# Patient Record
Sex: Female | Born: 1950 | Race: White | Hispanic: No | Marital: Married | Smoking: Never smoker
Health system: Southern US, Community
[De-identification: ages and names within clinical notes are randomized; demographics above are authoritative.]

## PROBLEM LIST (undated history)

## (undated) DIAGNOSIS — T7840XA Allergy, unspecified, initial encounter: Secondary | ICD-10-CM

## (undated) DIAGNOSIS — R519 Headache, unspecified: Secondary | ICD-10-CM

## (undated) DIAGNOSIS — C801 Malignant (primary) neoplasm, unspecified: Secondary | ICD-10-CM

## (undated) DIAGNOSIS — Z923 Personal history of irradiation: Secondary | ICD-10-CM

## (undated) DIAGNOSIS — R51 Headache: Secondary | ICD-10-CM

## (undated) DIAGNOSIS — N200 Calculus of kidney: Secondary | ICD-10-CM

## (undated) DIAGNOSIS — H579 Unspecified disorder of eye and adnexa: Secondary | ICD-10-CM

## (undated) HISTORY — DX: Headache, unspecified: R51.9

## (undated) HISTORY — DX: Allergy, unspecified, initial encounter: T78.40XA

## (undated) HISTORY — PX: BREAST BIOPSY: SHX20

## (undated) HISTORY — DX: Unspecified disorder of eye and adnexa: H57.9

## (undated) HISTORY — DX: Headache: R51

## (undated) HISTORY — PX: HERNIA REPAIR: SHX51

## (undated) HISTORY — DX: Calculus of kidney: N20.0

## (undated) HISTORY — DX: Malignant (primary) neoplasm, unspecified: C80.1

---

## 2012-01-03 DIAGNOSIS — C801 Malignant (primary) neoplasm, unspecified: Secondary | ICD-10-CM

## 2012-01-03 HISTORY — DX: Malignant (primary) neoplasm, unspecified: C80.1

## 2012-03-04 HISTORY — PX: BREAST LUMPECTOMY: SHX2

## 2013-12-23 LAB — HM MAMMOGRAPHY

## 2014-06-17 DIAGNOSIS — H4011X1 Primary open-angle glaucoma, mild stage: Secondary | ICD-10-CM | POA: Diagnosis not present

## 2014-09-10 ENCOUNTER — Encounter: Payer: Self-pay | Admitting: Family Medicine

## 2014-09-10 ENCOUNTER — Ambulatory Visit (INDEPENDENT_AMBULATORY_CARE_PROVIDER_SITE_OTHER): Payer: Medicare Other | Admitting: Family Medicine

## 2014-09-10 VITALS — BP 116/70 | HR 88 | Temp 98.0°F | Ht 60.5 in | Wt 132.5 lb

## 2014-09-10 DIAGNOSIS — R519 Headache, unspecified: Secondary | ICD-10-CM | POA: Insufficient documentation

## 2014-09-10 DIAGNOSIS — C50912 Malignant neoplasm of unspecified site of left female breast: Secondary | ICD-10-CM | POA: Diagnosis not present

## 2014-09-10 DIAGNOSIS — Z8619 Personal history of other infectious and parasitic diseases: Secondary | ICD-10-CM

## 2014-09-10 DIAGNOSIS — C801 Malignant (primary) neoplasm, unspecified: Secondary | ICD-10-CM

## 2014-09-10 DIAGNOSIS — R51 Headache: Secondary | ICD-10-CM

## 2014-09-10 DIAGNOSIS — Z853 Personal history of malignant neoplasm of breast: Secondary | ICD-10-CM | POA: Insufficient documentation

## 2014-09-10 DIAGNOSIS — H579 Unspecified disorder of eye and adnexa: Secondary | ICD-10-CM

## 2014-09-10 DIAGNOSIS — N200 Calculus of kidney: Secondary | ICD-10-CM

## 2014-09-10 DIAGNOSIS — D0512 Intraductal carcinoma in situ of left breast: Secondary | ICD-10-CM | POA: Insufficient documentation

## 2014-09-10 DIAGNOSIS — I7300 Raynaud's syndrome without gangrene: Secondary | ICD-10-CM

## 2014-09-10 NOTE — Assessment & Plan Note (Signed)
Taking ASA, wears gloves and long sleeves when necessary.

## 2014-09-10 NOTE — Patient Instructions (Addendum)
Great to meet you. Please consider Prevnar 13 vaccine.  Please make an appointment for a Welcome to Medicare Visit on your way out.  We will call you with your referrals or you can stop by to see Rosaria Ferries on your way out.

## 2014-09-10 NOTE — Progress Notes (Signed)
Pre visit review using our clinic review tool, if applicable. No additional management support is needed unless otherwise documented below in the visit note. 

## 2014-09-10 NOTE — Progress Notes (Signed)
Subjective:   Patient ID: Kari Willis, female    DOB: 1951-03-09, 64 y.o.   MRN: [id number]  Kari Willis is a pleasant 64 y.o. year old female who presents to clinic today with [place]  on 09/10/2014  HPI:  Moved to area from [place] last fall.  Has family in the area and she has a h/o [place]auynauds--wanted to move somewhere with warmer climate.  Breast CA- left- s/p lumpectomy and radiation in 2013.  Has been seeing breast surgeon and oncology once a year. Per pt, mammogram UTD- fall of 2015. Unfortunately, got shingles 2 weeks into radiation but she did complete the tx. Has since received zostavax.  Colonoscopy 2014.  Raynauds- takes ASA which does seem to help.  Wears gloves as well.  No current outpatient prescriptions on file prior to visit.   No current facility-administered medications on file prior to visit.    Allergies  Allergen Reactions  . Demerol [Meperidine]     "burning sensation throughout body"    Past Medical History  Diagnosis Date  . Cancer oct 2013    breast  . Eye pressure     "high eye pressure"  . Kidney stones   . Headache     Past Surgical History  Procedure Laterality Date  . Breast lumpectomy  dec 2013  . Breast biopsy    . Hernia repair      Family History  Problem Relation Age of Onset  . Cancer Mother   . Heart disease Father   . Heart disease Sister   . Heart disease Brother   . Stroke Paternal Aunt     History   Social History  . Marital Status: Married    Spouse Name: N/A  . Number of Children: N/A  . Years of Education: N/A   Occupational History  . Not on file.   Social History Main Topics  . Smoking status: Never Smoker   . Smokeless tobacco: Never Used  . Alcohol Use: Yes  . Drug Use: No  . Sexual Activity: No   Other Topics Concern  . Not on file   Social History Narrative   The PMH, PSH, Social History, Family History, Medications, and allergies have been reviewed in [facility], and have been updated if  relevant.    Review of Systems  Constitutional: Negative.   HENT: Negative.   Eyes: Negative.   Respiratory: Negative.   Cardiovascular: Negative.   Gastrointestinal: Negative.   Endocrine: Negative.   Genitourinary: Negative.   Musculoskeletal: Negative.   Skin: Negative.   Allergic/Immunologic: Negative.   Neurological: Negative.   Hematological: Negative.   Psychiatric/Behavioral: Negative.   All other systems reviewed and are negative.      Objective:    BP 116/70 mmHg  Pulse 88  Temp(Src) 98 F (36.7 C) (Oral)  Ht 5' 0.5" (1.537 m)  Wt 132 lb 8 oz (60.102 kg)  BMI 25.44 kg/m2  SpO2 97%   Physical Exam  Constitutional: She is oriented to person, place, and time. She appears well-developed and well-nourished. No distress.  HENT:  Head: Normocephalic.  Eyes: Conjunctivae are normal.  Neck: Normal range of motion. Neck supple.  Cardiovascular: Normal rate, regular rhythm and normal heart sounds.   Pulmonary/Chest: Effort normal and breath sounds normal. No respiratory distress.  Abdominal: Soft. Bowel sounds are normal.  Musculoskeletal: Normal range of motion. She exhibits no edema.  Neurological: She is alert and oriented to person, place, and time. No cranial nerve deficit.  Skin: Skin is warm and dry.  Psychiatric: She has a normal mood and affect. Her behavior is normal. Judgment and thought content normal.  Nursing note and vitals reviewed.         Assessment & Plan:   Cancer  Eye pressure  Kidney stones No Follow-up on file.

## 2014-09-10 NOTE — Assessment & Plan Note (Signed)
S/p lumpectomy and radiation tx. Refer to breast oncology/surgery for surveillance, likely yearly.

## 2014-09-22 ENCOUNTER — Telehealth: Payer: Self-pay | Admitting: *Deleted

## 2014-09-22 ENCOUNTER — Other Ambulatory Visit: Payer: Self-pay | Admitting: Family Medicine

## 2014-09-22 DIAGNOSIS — C50912 Malignant neoplasm of unspecified site of left female breast: Secondary | ICD-10-CM

## 2014-09-22 NOTE — Telephone Encounter (Signed)
Called pt to schedule a med onc appt and since she lives closer to [place] and would like to be seen there.  Sent Dr. Deborra Medina an inbasket requesting for the referral to be sent to [place].

## 2014-10-07 ENCOUNTER — Telehealth: Payer: Self-pay

## 2014-10-07 NOTE — Telephone Encounter (Signed)
Pt has fasting lab later this week and pt wants to know if can drink anything prior to labs on 10/09/14. Advised pt can have water or black coffee. Pt voiced understanding.

## 2014-10-09 ENCOUNTER — Other Ambulatory Visit (INDEPENDENT_AMBULATORY_CARE_PROVIDER_SITE_OTHER): Payer: Medicare Other

## 2014-10-09 ENCOUNTER — Other Ambulatory Visit: Payer: Self-pay | Admitting: Family Medicine

## 2014-10-09 DIAGNOSIS — E785 Hyperlipidemia, unspecified: Secondary | ICD-10-CM | POA: Diagnosis not present

## 2014-10-09 DIAGNOSIS — Z0000 Encounter for general adult medical examination without abnormal findings: Secondary | ICD-10-CM

## 2014-10-09 DIAGNOSIS — Z01419 Encounter for gynecological examination (general) (routine) without abnormal findings: Secondary | ICD-10-CM | POA: Insufficient documentation

## 2014-10-09 LAB — CBC WITH DIFFERENTIAL/PLATELET
Basophils Absolute: 0 10*3/uL (ref 0.0–0.1)
Basophils Relative: 1 % (ref 0.0–3.0)
EOS ABS: 0.1 10*3/uL (ref 0.0–0.7)
Eosinophils Relative: 2.5 % (ref 0.0–5.0)
HCT: 40.1 % (ref 36.0–46.0)
HEMOGLOBIN: 13.7 g/dL (ref 12.0–15.0)
LYMPHS PCT: 38.6 % (ref 12.0–46.0)
Lymphs Abs: 1.7 10*3/uL (ref 0.7–4.0)
MCHC: 34 g/dL (ref 30.0–36.0)
MCV: 94.8 fl (ref 78.0–100.0)
MONOS PCT: 8.5 % (ref 3.0–12.0)
Monocytes Absolute: 0.4 10*3/uL (ref 0.1–1.0)
Neutro Abs: 2.2 10*3/uL (ref 1.4–7.7)
Neutrophils Relative %: 49.4 % (ref 43.0–77.0)
Platelets: 192 10*3/uL (ref 150.0–400.0)
RBC: 4.24 Mil/uL (ref 3.87–5.11)
RDW: 13.3 % (ref 11.5–15.5)
WBC: 4.4 10*3/uL (ref 4.0–10.5)

## 2014-10-09 LAB — LIPID PANEL
CHOL/HDL RATIO: 4
Cholesterol: 241 mg/dL — ABNORMAL HIGH (ref 0–200)
HDL: 54.3 mg/dL (ref >39.00)
LDL CALC: 167 mg/dL — AB (ref 0–99)
NONHDL: 186.7
Triglycerides: 99 mg/dL (ref 0.0–149.0)
VLDL: 19.8 mg/dL (ref 0.0–40.0)

## 2014-10-09 LAB — COMPREHENSIVE METABOLIC PANEL
ALBUMIN: 4.2 g/dL (ref 3.5–5.2)
ALK PHOS: 45 U/L (ref 39–117)
ALT: 20 U/L (ref 0–35)
AST: 18 U/L (ref 0–37)
BUN: 12 mg/dL (ref 6–23)
CO2: 29 mEq/L (ref 19–32)
Calcium: 9.7 mg/dL (ref 8.4–10.5)
Chloride: 103 mEq/L (ref 96–112)
Creatinine, Ser: 0.92 mg/dL (ref 0.40–1.20)
GFR: 65.05 mL/min (ref >60.00)
Glucose, Bld: 100 mg/dL — ABNORMAL HIGH (ref 70–99)
POTASSIUM: 4.3 meq/L (ref 3.5–5.1)
Sodium: 137 mEq/L (ref 135–145)
Total Bilirubin: 0.9 mg/dL (ref 0.2–1.2)
Total Protein: 7.1 g/dL (ref 6.0–8.3)

## 2014-10-09 LAB — TSH: TSH: 1.73 u[IU]/mL (ref 0.35–4.50)

## 2014-10-14 ENCOUNTER — Encounter: Payer: Self-pay | Admitting: Family Medicine

## 2014-10-14 ENCOUNTER — Ambulatory Visit (INDEPENDENT_AMBULATORY_CARE_PROVIDER_SITE_OTHER): Payer: Medicare Other | Admitting: Family Medicine

## 2014-10-14 VITALS — BP 118/66 | HR 63 | Temp 97.6°F | Ht 60.25 in | Wt 132.0 lb

## 2014-10-14 DIAGNOSIS — Z66 Do not resuscitate: Secondary | ICD-10-CM | POA: Diagnosis not present

## 2014-10-14 DIAGNOSIS — H579 Unspecified disorder of eye and adnexa: Secondary | ICD-10-CM

## 2014-10-14 DIAGNOSIS — I7300 Raynaud's syndrome without gangrene: Secondary | ICD-10-CM

## 2014-10-14 DIAGNOSIS — Z0000 Encounter for general adult medical examination without abnormal findings: Secondary | ICD-10-CM

## 2014-10-14 DIAGNOSIS — E785 Hyperlipidemia, unspecified: Secondary | ICD-10-CM | POA: Diagnosis not present

## 2014-10-14 DIAGNOSIS — C50912 Malignant neoplasm of unspecified site of left female breast: Secondary | ICD-10-CM

## 2014-10-14 DIAGNOSIS — Z8619 Personal history of other infectious and parasitic diseases: Secondary | ICD-10-CM

## 2014-10-14 NOTE — Addendum Note (Signed)
Addended by: Lucille Passy on: 10/14/2014 02:04 PM   Modules accepted: Miquel Dunn

## 2014-10-14 NOTE — Patient Instructions (Signed)
Good to see you. Have a great time in [place].  Please cut back foods higher in cholesterol and increase physical activity.  Please follow up in 3 months for labs.

## 2014-10-14 NOTE — Progress Notes (Addendum)
Subjective:   Patient ID: Kari Willis, female    DOB: [birth year], 64 y.o.   MRN: [id number]  Kari Willis is a pleasant 64 y.o. year old female who established care with me in 09/2014, presents to clinic today with Annual Exam  and follow up of chronic medical conditions on 10/14/2014  HPI:  I have personally reviewed the Medicare Annual Wellness questionnaire and have noted 1. The patient's medical and social history 2. Their use of alcohol, tobacco or illicit drugs 3. Their current medications and supplements 4. The patient's functional ability including ADL's, fall risks, home safety risks and hearing or visual             impairment. 5. Diet and physical activities 6. Evidence for depression or mood disorders  End of life wishes discussed and updated in Social History.  The roster of all physicians providing medical care to patient - is listed in the CareTeams section of the chart.   Moved to area from [place] last fall.  Has family in the area and she has a h/o Rauynauds--wanted to move somewhere with warmer climate.  Breast CA- left- s/p lumpectomy and radiation in 2013.  Has been seeing breast surgeon and oncology once a year. Per pt, mammogram UTD- fall of 2015. Unfortunately, got shingles 2 weeks into radiation but she did complete the tx. Has since received zostavax. Eye exam March 2016 in [place] Colonoscopy 2014. Pap smear 03/2014  Raynauds- takes ASA which does seem to help.  Wears gloves as well.  HLD- cholesterol elevated this month.  She takes Niacin and fish oil. Lab Results  Component Value Date   CHOL 241* 10/09/2014   HDL 54.30 10/09/2014   LDLCALC 167* 10/09/2014   TRIG 99.0 10/09/2014   CHOLHDL 4 10/09/2014   Lab Results  Component Value Date   CREATININE 0.92 10/09/2014   Lab Results  Component Value Date   WBC 4.4 10/09/2014   HGB 13.7 10/09/2014   HCT 40.1 10/09/2014   MCV 94.8 10/09/2014   PLT 192.0 10/09/2014   Lab Results  Component Value  Date   NA 137 10/09/2014   K 4.3 10/09/2014   CL 103 10/09/2014   CO2 29 10/09/2014   Lab Results  Component Value Date   TSH 1.73 10/09/2014    Current Outpatient Prescriptions on File Prior to Visit  Medication Sig Dispense Refill  . aspirin 81 MG tablet Take 81 mg by mouth 3 (three) times a week.    . B Complex Vitamins (B COMPLEX-B12 PO) Take 1 tablet by mouth daily.    . Calcium Carbonate-Vitamin D (CALCIUM 600+D HIGH POTENCY PO) Take 1 tablet by mouth 2 (two) times daily.    . Ferrous Gluconate (IRON) 240 (27 FE) MG TABS Take 1 tablet by mouth 2 (two) times a week.    . Multiple Vitamins-Minerals (CENTRUM SILVER ADULT 50+ PO) Take 1 tablet by mouth daily.    . niacin 100 MG tablet Take 100 mg by mouth at bedtime.    . Omega-3 Fatty Acids (FISH OIL) 1200 MG CAPS Take 1 capsule by mouth.    . timolol (BETIMOL) 0.5 % ophthalmic solution Place 1 drop into both eyes 2 (two) times daily.    . vitamin C (ASCORBIC ACID) 500 MG tablet Take 500 mg by mouth 2 (two) times daily.    . vitamin E 400 UNIT capsule Take 400 Units by mouth daily.     No current facility-administered medications on file prior  to visit.    Allergies  Allergen Reactions  . Demerol [Meperidine]     "burning sensation throughout body"    Past Medical History  Diagnosis Date  . Cancer oct 2013    breast  . Eye pressure     "high eye pressure"  . Kidney stones   . Headache     Past Surgical History  Procedure Laterality Date  . Breast lumpectomy  dec 2013  . Breast biopsy    . Hernia repair      Family History  Problem Relation Age of Onset  . Cancer Mother   . Heart disease Father   . Heart disease Sister   . Heart disease Brother   . Stroke Paternal Aunt     History   Social History  . Marital Status: Married    Spouse Name: N/A  . Number of Children: N/A  . Years of Education: N/A   Occupational History  . Not on file.   Social History Main Topics  . Smoking status: Never Smoker    . Smokeless tobacco: Never Used  . Alcohol Use: Yes  . Drug Use: No  . Sexual Activity: No   Other Topics Concern  . Not on file   Social History Narrative   Married.   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    Review of Systems  Constitutional: Negative.   HENT: Negative.   Eyes: Negative.   Respiratory: Negative.   Cardiovascular: Negative.   Gastrointestinal: Negative.   Endocrine: Negative.   Genitourinary: Negative.   Musculoskeletal: Negative.   Skin: Negative.   Allergic/Immunologic: Negative.   Neurological: Negative.   Hematological: Negative.   Psychiatric/Behavioral: Negative.   All other systems reviewed and are negative.      Objective:    BP 118/66 mmHg  Pulse 63  Temp(Src) 97.6 F (36.4 C) (Oral)  Ht 5' 0.25" (1.53 m)  Wt 132 lb (59.875 kg)  BMI 25.58 kg/m2  SpO2 98%   Physical Exam  Constitutional: She is oriented to person, place, and time. She appears well-developed and well-nourished. No distress.  HENT:  Head: Normocephalic.  Eyes: Conjunctivae are normal.  Neck: Normal range of motion. Neck supple.  Cardiovascular: Normal rate, regular rhythm and normal heart sounds.   Pulmonary/Chest: Effort normal and breath sounds normal. No respiratory distress.  Abdominal: Soft. Bowel sounds are normal.  Musculoskeletal: Normal range of motion. She exhibits no edema.  Neurological: She is alert and oriented to person, place, and time. No cranial nerve deficit.  Skin: Skin is warm and dry.  Psychiatric: She has a normal mood and affect. Her behavior is normal. Judgment and thought content normal.  Nursing note and vitals reviewed.         Assessment & Plan:   Welcome to Medicare preventive visit  Eye pressure  Breast cancer, left breast  History of shingles  Raynaud disease No  Follow-up on file.

## 2014-10-14 NOTE — Assessment & Plan Note (Signed)
End of life wishes discussed and updated in Social History.

## 2014-10-14 NOTE — Assessment & Plan Note (Signed)
New-  Discussed tx options.  She wants to work on diet and exercise.  Does have strong FH of HLD. Follow up labs in 3 months.

## 2014-10-14 NOTE — Assessment & Plan Note (Addendum)
The patients weight, height, BMI and visual acuity have been recorded in the chart.  Cognitive function assessed.   I have made referrals, counseling and provided education to the patient based review of the above and I have provided the pt with a written personalized care plan for preventive services.  EKG done today- reassuring- sinus rhythm with borderline short PR.  Prevnar 13 today. Declined prevnar 13.

## 2014-10-14 NOTE — Assessment & Plan Note (Signed)
Getting established with local breast surgeon and oncologist.  Mammogram UTD.

## 2014-10-14 NOTE — Progress Notes (Signed)
Pre visit review using our clinic review tool, if applicable. No additional management support is needed unless otherwise documented below in the visit note. 

## 2014-11-03 ENCOUNTER — Encounter: Payer: Self-pay | Admitting: *Deleted

## 2014-11-11 ENCOUNTER — Encounter: Payer: Self-pay | Admitting: Family Medicine

## 2014-11-18 ENCOUNTER — Encounter: Payer: Self-pay | Admitting: Internal Medicine

## 2014-11-18 ENCOUNTER — Inpatient Hospital Stay: Payer: Medicare Other | Attending: Internal Medicine | Admitting: Internal Medicine

## 2014-11-18 VITALS — BP 117/77 | HR 66 | Temp 98.0°F | Resp 18 | Ht 60.25 in | Wt 133.6 lb

## 2014-11-18 DIAGNOSIS — Z923 Personal history of irradiation: Secondary | ICD-10-CM | POA: Diagnosis not present

## 2014-11-18 DIAGNOSIS — D0512 Intraductal carcinoma in situ of left breast: Secondary | ICD-10-CM | POA: Insufficient documentation

## 2014-11-18 DIAGNOSIS — Z171 Estrogen receptor negative status [ER-]: Secondary | ICD-10-CM | POA: Diagnosis not present

## 2014-11-18 NOTE — Progress Notes (Signed)
Pt new pt to get established for DCIS 2013.  Pt had her last mammogram dec 2015 in [place] where she lived until Dec 2015.  She has all her films/CD for her mammograms so when she needs next mammogram she will bring with her to appt.  She has appt in sept with surgeon in [place] and once she sees that md she will let us know if she needs mammogram ordered by [place] or if surgeon going to order it.  My direct number written down for her to call me.

## 2014-12-01 NOTE — Progress Notes (Signed)
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 Layla Maw on 03/16/12 (in [place], [place]). Then completed radiation on 06/08/12.   Surgical Pathology report on 03/16/12 - wire guided lumpectomy - scattered ducts with residual DCIS adjacent to prior biopsy site; close medial margin (0.2 cm); no invasive carcinoma seen. Initial biopsy 02/15/12  - DCIS, grade 3, ER and PR negative.  -  patient recently moved here, referred here for establishing Oncology follow-up.   HISTORY OF PRESENT ILLNESS:  Kari Willis is a 64 year old female with past medical and surgical history as described below. Patient has history of DCIS left breast diagnosed and treated in [facility] in 2013. Records by Dr. Paulo Fruit from there states that she was initially found to have abnormal mammogram in November 2013, she underwent stereotactic biopsy on November 13 and was found to have ER/PR negative DCIS and underwent lumpectomy on 03/16/2012. Surgical pathology was DCIS intermediate nuclear grade, she then completed radiation March 2014. Patient states that she also had another left breast biopsy in 2015 which was reportedly negative for DCIS of malignancy. Clinically otherwise doing well. Appetite is good, denies unintentional weight loss. No new bone pains. Denies feeling any new breast masses on self exam.   REVIEW OF SYSTEMS:   ROS CONSTITUTIONAL: As in HPI above. No chills, fever or sweats.    ENT:  No headache, dizziness or epistaxis. No ear or jaw pain. RESPIRATORY:   No cough.  No shortness of breath. No wheezing. No hemoptysis. CARDIAC:  No  palpitations.  No retrosternal chest pain. No orthopnea, PND. GI:  No abdominal pain, nausea or vomiting. No diarrhea.   GU:  No dysuria or hematuria.  SKIN: No rashes or pruritus. HEMATOLOGIC: denies bleeding symptoms MUSCULOSKELETAL:  No new bone pains.  EXTREMITY:  No new swelling or pain.  NEURO:  No focal weakness. No numbness or tingling of extremities.     ENDOCRINE:  No polyuria or polydipsia.   PAST MEDICAL HISTORY: Reviewed. Past Medical History  Diagnosis Date  . Cancer oct 2013    breast  . Eye pressure     "high eye pressure"  . Kidney stones   . Headache   Ocular hypertension Cervical dysplasia, status post cervical cone biopsy 1990s   PAST SURGICAL HISTORY: Reviewed. Past Surgical History  Procedure Laterality Date  . Breast lumpectomy  dec 2013  . Breast biopsy    . Hernia repair      FAMILY HISTORY: Reviewed. Family History  Problem Relation Age of Onset  . Cancer Mother   . Heart disease Father   . Heart disease Sister   . Heart disease Brother   . Stroke Paternal Aunt     SOCIAL HISTORY: Reviewed. Social History  Substance Use Topics  . Smoking status: Never Smoker   . Smokeless tobacco: Never Used  . Alcohol Use: Yes     Comment: one glass of wine every 6 months    Allergies  Allergen Reactions  . Demerol [Meperidine]     "burning sensation throughout body"    Current Outpatient Prescriptions  Medication Sig Dispense Refill  . aspirin 81 MG tablet Take 81 mg by mouth 3 (  three) times a week.    . B Complex Vitamins (B COMPLEX-B12 PO) Take 1 tablet by mouth daily.    . Calcium Carbonate-Vitamin D (CALCIUM 600+D HIGH POTENCY PO) Take 1 tablet by mouth 2 (two) times daily.    . Ferrous Gluconate (IRON) 240 (27 FE) MG TABS Take 1 tablet by mouth 2 (two) times a week.    . Multiple Vitamins-Minerals (CENTRUM SILVER ADULT 50+ PO) Take 1 tablet by mouth daily.    . niacin 250 MG tablet Take 250 mg by mouth at bedtime.    . Omega-3 Fatty Acids  (FISH OIL) 1200 MG CAPS Take 1 capsule by mouth.    . timolol (BETIMOL) 0.5 % ophthalmic solution Place 1 drop into both eyes 2 (two) times daily.    . vitamin C (ASCORBIC ACID) 500 MG tablet Take 500 mg by mouth 2 (two) times daily.    . vitamin E 400 UNIT capsule Take 400 Units by mouth daily.     No current facility-administered medications for this visit.    PHYSICAL EXAM: Filed Vitals:   11/18/14 0938  BP: 117/77  Pulse: 66  Temp: 98 F (36.7 C)  Resp: 18     Body mass index is 25.89 kg/(m^2).     GENERAL: Patient is alert and oriented and in no acute distress. There is no icterus. HEENT: EOMs intact. Oral exam negative for thrush or lesions. No cervical lymphadenopathy. CVS: S1S2, regular LUNGS: Bilaterally clear to auscultation, no rhonchi. ABDOMEN: Soft, nontender. No hepatomegaly clinically.  NEURO: grossly nonfocal, cranial nerves are intact.  EXTREMITIES: No pedal edema. SKIN: No major bruising or rash BREASTS: No dominant masses in either breast. No axillary adenopathy on either side. Exam performed in presence of a nurse.   LAB RESULTS:    Component Value Date/Time   NA 137 10/09/2014 1013   K 4.3 10/09/2014 1013   CL 103 10/09/2014 1013   CO2 29 10/09/2014 1013   GLUCOSE 100* 10/09/2014 1013   BUN 12 10/09/2014 1013   CREATININE 0.92 10/09/2014 1013   CALCIUM 9.7 10/09/2014 1013   PROT 7.1 10/09/2014 1013   ALBUMIN 4.2 10/09/2014 1013   AST 18 10/09/2014 1013   ALT 20 10/09/2014 1013   ALKPHOS 45 10/09/2014 1013   BILITOT 0.9 10/09/2014 1013    Lab Results  Component Value Date   WBC 4.4 10/09/2014   NEUTROABS 2.2 10/09/2014   HGB 13.7 10/09/2014   HCT 40.1 10/09/2014   MCV 94.8 10/09/2014   PLT 192.0 10/09/2014     ASSESSMENT / PLAN:   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 Layla Maw on 03/16/12 (in [place], [place]). Then  completed radiation on 06/08/12.  Surgical Pathology report on 03/16/12 - wire guided lumpectomy - scattered ducts with residual DCIS adjacent to prior biopsy site; close medial margin (0.2 cm); no invasive carcinoma seen.  Initial biopsy 02/15/12  - DCIS, grade 3, ER and PR negative. -  patient recently moved here, referred here for establishing Oncology follow-up. Have reviewed previous records sent to me and discussed with patient. She has a history of DCIS treated in 2013/2014 as described above, was not a candidate for tamoxifen treatment since ER/PR was negative. Clinically no abnormal masses felt on breast exam today, patient states that last mammogram in September 2015 also was unremarkable. States that next mammogram has been scheduled for this coming September/October. She prefers  to continue surveillance with oncology, will therefore see her back in one year with CBC, creatinine, LFT. In between visits, she was advised to call in case of any new breast masses felt on self-exam or other concerns and will be evaluated sooner. Patient is agreeable to this plan.   Leia Alf, MD   12/01/2014 12:43 PM

## 2014-12-17 ENCOUNTER — Other Ambulatory Visit: Payer: Self-pay | Admitting: Surgery

## 2014-12-17 DIAGNOSIS — Z853 Personal history of malignant neoplasm of breast: Secondary | ICD-10-CM | POA: Diagnosis not present

## 2014-12-19 ENCOUNTER — Telehealth: Payer: Self-pay | Admitting: *Deleted

## 2014-12-19 NOTE — Telephone Encounter (Signed)
Pt states that she saw Dr. Lucia Gaskins and he rec: seeing her every 6 months and rec: that pandit see her every 6 months so that she is seen twice a year for breast cancer.  She did have dr. Lucia Gaskins to schedule her a mammogram for there in the facility where he is and when the calendar opens up for her 6 month appt for [facility] then she will call us and get an appt for pandit.

## 2015-01-07 ENCOUNTER — Other Ambulatory Visit: Payer: Self-pay | Admitting: Family Medicine

## 2015-01-07 DIAGNOSIS — E785 Hyperlipidemia, unspecified: Secondary | ICD-10-CM

## 2015-01-14 ENCOUNTER — Other Ambulatory Visit (INDEPENDENT_AMBULATORY_CARE_PROVIDER_SITE_OTHER): Payer: Medicare Other

## 2015-01-14 DIAGNOSIS — E785 Hyperlipidemia, unspecified: Secondary | ICD-10-CM

## 2015-01-14 LAB — LIPID PANEL
Cholesterol: 230 mg/dL — ABNORMAL HIGH (ref 0–200)
HDL: 51.7 mg/dL (ref >39.00)
LDL CALC: 154 mg/dL — AB (ref 0–99)
NONHDL: 178.64
Total CHOL/HDL Ratio: 4
Triglycerides: 122 mg/dL (ref 0.0–149.0)
VLDL: 24.4 mg/dL (ref 0.0–40.0)

## 2015-01-14 LAB — COMPREHENSIVE METABOLIC PANEL
ALT: 21 U/L (ref 0–35)
AST: 20 U/L (ref 0–37)
Albumin: 4.3 g/dL (ref 3.5–5.2)
Alkaline Phosphatase: 51 U/L (ref 39–117)
BUN: 13 mg/dL (ref 6–23)
CHLORIDE: 102 meq/L (ref 96–112)
CO2: 32 mEq/L (ref 19–32)
Calcium: 10 mg/dL (ref 8.4–10.5)
Creatinine, Ser: 0.87 mg/dL (ref 0.40–1.20)
GFR: 69.33 mL/min (ref >60.00)
GLUCOSE: 106 mg/dL — AB (ref 70–99)
POTASSIUM: 4.8 meq/L (ref 3.5–5.1)
SODIUM: 138 meq/L (ref 135–145)
Total Bilirubin: 0.8 mg/dL (ref 0.2–1.2)
Total Protein: 7.2 g/dL (ref 6.0–8.3)

## 2015-01-15 ENCOUNTER — Encounter: Payer: Self-pay | Admitting: *Deleted

## 2015-01-21 ENCOUNTER — Ambulatory Visit (INDEPENDENT_AMBULATORY_CARE_PROVIDER_SITE_OTHER): Payer: Medicare Other | Admitting: *Deleted

## 2015-01-21 DIAGNOSIS — Z23 Encounter for immunization: Secondary | ICD-10-CM | POA: Diagnosis not present

## 2015-03-13 ENCOUNTER — Telehealth: Payer: Self-pay | Admitting: *Deleted

## 2015-03-13 NOTE — Telephone Encounter (Signed)
Lm on pts vm requesting a cb if wanting to schedule a flu shot

## 2015-03-18 ENCOUNTER — Ambulatory Visit
Admission: RE | Admit: 2015-03-18 | Discharge: 2015-03-18 | Disposition: A | Discharge status: Home / Self Care | Payer: Medicare Other | Source: Ambulatory Visit | Attending: Surgery | Admitting: Surgery

## 2015-03-18 DIAGNOSIS — R922 Inconclusive mammogram: Secondary | ICD-10-CM | POA: Diagnosis not present

## 2015-03-18 NOTE — Telephone Encounter (Signed)
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   Form in Dr. Hulen [facility] tower in-box.

## 2015-03-24 ENCOUNTER — Telehealth: Payer: Self-pay | Admitting: *Deleted

## 2015-03-24 NOTE — Telephone Encounter (Signed)
Pt called stating on MyChart it is saying she is still overdue for her flu shot and would like this to be updated. She said she got her flu shot and a tetanus shot done on 02/12/15.

## 2015-03-24 NOTE — Telephone Encounter (Signed)
Her chart has been updated.

## 2015-03-24 NOTE — Telephone Encounter (Signed)
Chart updated

## 2015-03-24 NOTE — Telephone Encounter (Signed)
Please see note below, this was for the wife not the husband.    Despina Hidden, CMA at 03/24/2015 2:26 PM     Status: Signed       Expand All Collapse All   This was already done, and it's in the pt's chart.            Chad Cordial at 03/24/2015 1:38 PM     Status: Signed       Expand All Collapse All   Pt's wife called in asking if his MyChart could be updated with the flu shot being done on 02/12/15. It is still saying he is overdue for one.

## 2015-04-02 ENCOUNTER — Ambulatory Visit (INDEPENDENT_AMBULATORY_CARE_PROVIDER_SITE_OTHER): Payer: Medicare Other | Admitting: Internal Medicine

## 2015-04-02 ENCOUNTER — Encounter: Payer: Self-pay | Admitting: Internal Medicine

## 2015-04-02 VITALS — BP 116/68 | HR 66 | Temp 98.1°F | Wt 133.0 lb

## 2015-04-02 DIAGNOSIS — K1120 Sialoadenitis, unspecified: Secondary | ICD-10-CM

## 2015-04-02 MED ORDER — CLINDAMYCIN HCL 300 MG PO CAPS: 300.0000 mg | ORAL_CAPSULE | Freq: Three times a day (TID) | ORAL | Status: DC

## 2015-04-02 NOTE — Progress Notes (Signed)
Pre visit review using our clinic review tool, if applicable. No additional management support is needed unless otherwise documented below in the visit note. 

## 2015-04-02 NOTE — Progress Notes (Signed)
Subjective:    Patient ID: Kari Willis, female    DOB: 01/01/1951, 64 y.o.   MRN: [id number]  HPI  Pt presents to the clinic today with c/o right side facial swelling. She noticed this yesterday after sucking on a piece of Lindt chocolate. She has no pain with chewing. She has not difficulty breathing or swelling of the throat. She has had Lindt chocolate in the past. She has tried Motrin with minimal relief. She did try to make an appt with a dentist but reports she could not get in until Monday.  Review of Systems      Past Medical History  Diagnosis Date  . Cancer [facility]) oct 2013    breast  . Eye pressure     "high eye pressure"  . Kidney stones   . Headache     Current Outpatient Prescriptions  Medication Sig Dispense Refill  . aspirin 81 MG tablet Take 81 mg by mouth 3 (three) times a week.    . B Complex Vitamins (B COMPLEX-B12 PO) Take 1 tablet by mouth daily.    . Calcium Carbonate-Vitamin D (CALCIUM 600+D HIGH POTENCY PO) Take 1 tablet by mouth 2 (two) times daily.    . Ferrous Gluconate (IRON) 240 (27 FE) MG TABS Take 1 tablet by mouth 2 (two) times a week.    . Multiple Vitamins-Minerals (CENTRUM SILVER ADULT 50+ PO) Take 1 tablet by mouth daily.    . niacin 250 MG tablet Take 250 mg by mouth at bedtime.    . Omega-3 Fatty Acids (FISH OIL) 1200 MG CAPS Take 1 capsule by mouth.    . timolol (BETIMOL) 0.5 % ophthalmic solution Place 1 drop into both eyes 2 (two) times daily.    . vitamin C (ASCORBIC ACID) 500 MG tablet Take 500 mg by mouth 2 (two) times daily.    . vitamin E 400 UNIT capsule Take 400 Units by mouth daily.     No current facility-administered medications for this visit.    Allergies  Allergen Reactions  . Demerol [Meperidine]     "burning sensation throughout body"    Family History  Problem Relation Age of Onset  . Cancer Mother   . Heart disease Father   . Heart disease Sister   . Heart disease Brother   . Stroke Paternal Aunt      Social History   Social History  . Marital Status: Married    Spouse Name: N/A  . Number of Children: N/A  . Years of Education: N/A   Occupational History  . Not on file.   Social History Main Topics  . Smoking status: Never Smoker   . Smokeless tobacco: Never Used  . Alcohol Use: Yes     Comment: one glass of wine every 6 months  . Drug Use: No  . Sexual Activity: No   Other Topics Concern  . Not on file   Social History Narrative   Married.   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  HEENT: Denies eye pain, eye redness, ear pain, ringing in the ears, wax buildup, runny nose, nasal congestion, bloody nose, or sore throat. Respiratory: Denies difficulty breathing, shortness of breath, cough or sputum production.   Cardiovascular: Denies chest pain, chest tightness, palpitations or swelling in the  hands or feet.  Musculoskeletal: Pt reports jaw pain. Denies decrease in range of motion, difficulty with gait, muscle pain.  Skin: Denies redness, rashes, lesions or ulcercations.    No other specific complaints in a complete review of systems (except as listed in HPI above).  Objective:   Physical Exam   BP 116/68 mmHg  Pulse 66  Temp(Src) 98.1 F (36.7 C) (Oral)  Wt 133 lb (60.328 kg)  SpO2 98% Wt Readings from Last 3 Encounters:  04/02/15 133 lb (60.328 kg)  11/18/14 133 lb 9.6 oz (60.6 kg)  10/14/14 132 lb (59.875 kg)    General: Appears her stated age, well developed, well nourished in NAD. Skin: Warm, dry and intact. Redness noted over right jaw. HEENT: Head: normal shape and size; Throat/Mouth: Teeth present, mucosa pink and moist, no exudate, lesions or ulcerations noted.  Neck:  Parotid mass enlarged, hard and tender to touch. Cardiovascular: Normal rate and rhythm. S1,S2 noted.  No murmur, rubs  or gallops noted.  Pulmonary/Chest: Normal effort and positive vesicular breath sounds. No respiratory distress. No wheezes, rales or ronchi noted.   BMET    Component Value Date/Time   NA 138 01/14/2015 0843   K 4.8 01/14/2015 0843   CL 102 01/14/2015 0843   CO2 32 01/14/2015 0843   GLUCOSE 106* 01/14/2015 0843   BUN 13 01/14/2015 0843   CREATININE 0.87 01/14/2015 0843   CALCIUM 10.0 01/14/2015 0843    Lipid Panel     Component Value Date/Time   CHOL 230* 01/14/2015 0843   TRIG 122.0 01/14/2015 0843   HDL 51.70 01/14/2015 0843   CHOLHDL 4 01/14/2015 0843   VLDL 24.4 01/14/2015 0843   LDLCALC 154* 01/14/2015 0843    CBC    Component Value Date/Time   WBC 4.4 10/09/2014 1013   RBC 4.24 10/09/2014 1013   HGB 13.7 10/09/2014 1013   HCT 40.1 10/09/2014 1013   PLT 192.0 10/09/2014 1013   MCV 94.8 10/09/2014 1013   MCHC 34.0 10/09/2014 1013   RDW 13.3 10/09/2014 1013   LYMPHSABS 1.7 10/09/2014 1013   MONOABS 0.4 10/09/2014 1013   EOSABS 0.1 10/09/2014 1013   BASOSABS 0.0 10/09/2014 1013    Hgb A1C No results found for: HGBA1C      Assessment & Plan:   Parotitis, right side:  eRx for Clindamycin TID x 10 days Continue Motrin Try hot or cool compress for comfort Keep the appt with the dentist in case symptoms worsen over the weekend Return precautions given  RTC as needed or if symptoms persist or worsen

## 2015-04-02 NOTE — Patient Instructions (Signed)
Parotitis Parotitis is soreness and inflammation of one or both parotid glands. The parotid glands produce saliva. They are located on each side of the face, below and in front of the earlobes. The saliva produced comes out of tiny openings (ducts) inside the cheeks. In most cases, parotitis goes away over time or with treatment. If your parotitis is caused by certain long-term (chronic) diseases, it may come back again.  CAUSES  Parotitis can be caused by:  Viral infections. Mumps is one viral infection that can cause parotitis.  Bacterial infections.  Blockage of the salivary ducts due to a salivary stone.  Narrowing of the salivary ducts.  Swelling of the salivary ducts.  Dehydration.  Autoimmune conditions, such as sarcoidosis or Sjogren syndrome.  Air from activities such as scuba diving, glass blowing, or playing an instrument (rare).  Human immunodeficiency virus (HIV) or acquired immunodeficiency syndrome (AIDS).  Tuberculosis. SIGNS AND SYMPTOMS   The ears may appear to be pushed up and out from their normal position.  Redness (erythema) of the skin over the parotid glands.  Pain and tenderness over the parotid glands.  Swelling in the parotid gland area.  Yellowish-white fluid (pus) coming from the ducts inside the cheeks.  Dry mouth.  Bad taste in the mouth. DIAGNOSIS  Your health care provider may determine that you have parotitis based on your symptoms and a physical exam. A sample of fluid may also be taken from the parotid gland and tested to find the cause of your infection. X-rays or computed tomography (CT) scans may be taken if your health care provider thinks you might have a salivary stone blocking your salivary duct. TREATMENT  Treatment varies depending upon the cause of your parotitis. If your parotitis is caused by mumps, no treatment is needed. The condition will go away on its own after 7 to 10 days. In other cases, treatment may  include:  Antibiotic medicine if your infection was caused by bacteria.  Pain medicines.  Gland massage.  Eating sour candy to increase your saliva production.  Removal of salivary stones. Your health care provider may flush stones out with fluids or remove them with tweezers.  Surgery to remove the parotid glands. HOME CARE INSTRUCTIONS   If you were prescribed an antibiotic medicine, finish it all even if you start to feel better.  Put warm compresses on the sore area.  Take medicines only as directed by your health care provider.  Drink enough fluids to keep your urine clear or pale yellow. SEEK IMMEDIATE MEDICAL CARE IF:   You have increasing pain or swelling that is not controlled with medicine.  You have a fever. MAKE SURE YOU:  Understand these instructions.  Will watch your condition.  Will get help right away if you are not doing well or get worse.   This information is not intended to replace advice given to you by your health care provider. Make sure you discuss any questions you have with your health care provider.   Document Released: 09/10/2001 Document Revised: 04/11/2014 Document Reviewed: 08/14/2014 Elsevier Interactive Patient Education 2016 [employer]  

## 2015-06-11 DIAGNOSIS — Z853 Personal history of malignant neoplasm of breast: Secondary | ICD-10-CM | POA: Diagnosis not present

## 2015-06-16 DIAGNOSIS — H524 Presbyopia: Secondary | ICD-10-CM | POA: Diagnosis not present

## 2015-06-16 DIAGNOSIS — H40003 Preglaucoma, unspecified, bilateral: Secondary | ICD-10-CM | POA: Diagnosis not present

## 2015-06-16 DIAGNOSIS — H5212 Myopia, left eye: Secondary | ICD-10-CM | POA: Diagnosis not present

## 2015-06-16 DIAGNOSIS — H52221 Regular astigmatism, right eye: Secondary | ICD-10-CM | POA: Diagnosis not present

## 2015-06-30 DIAGNOSIS — H40053 Ocular hypertension, bilateral: Secondary | ICD-10-CM | POA: Diagnosis not present

## 2015-06-30 DIAGNOSIS — H5340 Unspecified visual field defects: Secondary | ICD-10-CM | POA: Diagnosis not present

## 2015-10-09 ENCOUNTER — Other Ambulatory Visit: Payer: Self-pay | Admitting: Family Medicine

## 2015-10-09 DIAGNOSIS — Z01419 Encounter for gynecological examination (general) (routine) without abnormal findings: Secondary | ICD-10-CM | POA: Insufficient documentation

## 2015-10-09 DIAGNOSIS — E785 Hyperlipidemia, unspecified: Secondary | ICD-10-CM

## 2015-10-14 ENCOUNTER — Telehealth: Payer: Self-pay

## 2015-10-14 DIAGNOSIS — Z1159 Encounter for screening for other viral diseases: Secondary | ICD-10-CM

## 2015-10-14 NOTE — Telephone Encounter (Signed)
Adding Hep C to lab orders

## 2015-10-15 ENCOUNTER — Ambulatory Visit (INDEPENDENT_AMBULATORY_CARE_PROVIDER_SITE_OTHER): Payer: Medicare Other

## 2015-10-15 ENCOUNTER — Other Ambulatory Visit (INDEPENDENT_AMBULATORY_CARE_PROVIDER_SITE_OTHER): Payer: Medicare Other

## 2015-10-15 VITALS — BP 100/68 | HR 56 | Temp 97.9°F | Ht 60.5 in | Wt 128.2 lb

## 2015-10-15 DIAGNOSIS — Z0000 Encounter for general adult medical examination without abnormal findings: Secondary | ICD-10-CM

## 2015-10-15 DIAGNOSIS — Z1159 Encounter for screening for other viral diseases: Secondary | ICD-10-CM

## 2015-10-15 DIAGNOSIS — Z01419 Encounter for gynecological examination (general) (routine) without abnormal findings: Secondary | ICD-10-CM

## 2015-10-15 DIAGNOSIS — E785 Hyperlipidemia, unspecified: Secondary | ICD-10-CM

## 2015-10-15 LAB — TSH: TSH: 2.16 u[IU]/mL (ref 0.35–4.50)

## 2015-10-15 LAB — CBC WITH DIFFERENTIAL/PLATELET
Basophils Absolute: 0.1 10*3/uL (ref 0.0–0.1)
Basophils Relative: 1.3 % (ref 0.0–3.0)
EOS PCT: 3.6 % (ref 0.0–5.0)
Eosinophils Absolute: 0.2 10*3/uL (ref 0.0–0.7)
HCT: 40.8 % (ref 36.0–46.0)
Hemoglobin: 13.6 g/dL (ref 12.0–15.0)
LYMPHS ABS: 1.8 10*3/uL (ref 0.7–4.0)
Lymphocytes Relative: 34.4 % (ref 12.0–46.0)
MCHC: 33.5 g/dL (ref 30.0–36.0)
MCV: 95 fl (ref 78.0–100.0)
MONO ABS: 0.5 10*3/uL (ref 0.1–1.0)
MONOS PCT: 9.4 % (ref 3.0–12.0)
NEUTROS ABS: 2.7 10*3/uL (ref 1.4–7.7)
NEUTROS PCT: 51.3 % (ref 43.0–77.0)
PLATELETS: 197 10*3/uL (ref 150.0–400.0)
RBC: 4.29 Mil/uL (ref 3.87–5.11)
RDW: 13.1 % (ref 11.5–15.5)
WBC: 5.3 10*3/uL (ref 4.0–10.5)

## 2015-10-15 LAB — COMPREHENSIVE METABOLIC PANEL
ALT: 19 U/L (ref 0–35)
AST: 20 U/L (ref 0–37)
Albumin: 4.3 g/dL (ref 3.5–5.2)
Alkaline Phosphatase: 45 U/L (ref 39–117)
BUN: 8 mg/dL (ref 6–23)
CHLORIDE: 104 meq/L (ref 96–112)
CO2: 33 meq/L — AB (ref 19–32)
Calcium: 10 mg/dL (ref 8.4–10.5)
Creatinine, Ser: 0.93 mg/dL (ref 0.40–1.20)
GFR: 64.05 mL/min (ref >60.00)
GLUCOSE: 104 mg/dL — AB (ref 70–99)
POTASSIUM: 5 meq/L (ref 3.5–5.1)
SODIUM: 139 meq/L (ref 135–145)
TOTAL PROTEIN: 7.2 g/dL (ref 6.0–8.3)
Total Bilirubin: 0.9 mg/dL (ref 0.2–1.2)

## 2015-10-15 LAB — LIPID PANEL
CHOLESTEROL: 201 mg/dL — AB (ref 0–200)
HDL: 47.6 mg/dL (ref >39.00)
LDL CALC: 129 mg/dL — AB (ref 0–99)
NonHDL: 153.24
TRIGLYCERIDES: 122 mg/dL (ref 0.0–149.0)
Total CHOL/HDL Ratio: 4
VLDL: 24.4 mg/dL (ref 0.0–40.0)

## 2015-10-15 NOTE — Patient Instructions (Signed)
Kari Willis , Thank you for taking time to come for your Medicare Wellness Visit. I appreciate your ongoing commitment to your health goals. Please review the following plan we discussed and let me know if I can assist you in the future.   These are the goals we discussed: Goals    . Increase physical activity     Starting 10/15/2015, I will continue to exercise for at least 30 min 3 days per week.        This is a list of the screening recommended for you and due dates:  Health Maintenance  Topic Date Due  . Flu Shot  11/03/2015  . Pneumonia vaccines (2 of 2 - PPSV23) 01/21/2016  . Mammogram  03/17/2017  . Colon Cancer Screening  02/09/2023  . Tetanus Vaccine  02/11/2025  . DEXA scan (bone density measurement)  Completed  . Shingles Vaccine  Completed  .  Hepatitis C: One time screening is recommended by Center for Disease Control  (CDC) for  adults born from [birth year] through 1966.   Completed    Preventive Care for Adults  A healthy lifestyle and preventive care can promote health and wellness. Preventive health guidelines for adults include the following key practices.  . A routine yearly physical is a good way to check with your health care provider about your health and preventive screening. It is a chance to share any concerns and updates on your health and to receive a thorough exam.  . Visit your dentist for a routine exam and preventive care every 6 months. Brush your teeth twice a day and floss once a day. Good oral hygiene prevents tooth decay and gum disease.  . The frequency of eye exams is based on your age, health, family medical history, use  of contact lenses, and other factors. Follow your health care provider's ecommendations for frequency of eye exams.  . Eat a healthy diet. Foods like vegetables, fruits, whole grains, low-fat dairy products, and lean protein foods contain the nutrients you need without too many calories. Decrease your intake of foods high in solid  fats, added sugars, and salt. Eat the right amount of calories for you. Get information about a proper diet from your health care provider, if necessary.  . Regular physical exercise is one of the most important things you can do for your health. Most adults should get at least 150 minutes of moderate-intensity exercise (any activity that increases your heart rate and causes you to sweat) each week. In addition, most adults need muscle-strengthening exercises on 2 or more days a week.  Silver Sneakers may be a benefit available to you. To determine eligibility, you may visit the website: www.silversneakers.com or contact program at 573-645-2388 Mon-Fri between 8AM-8PM.   . Maintain a healthy weight. The body mass index (BMI) is a screening tool to identify possible weight problems. It provides an estimate of body fat based on height and weight. Your health care provider can find your BMI and can help you achieve or maintain a healthy weight.   For adults 20 years and older: ? A BMI below 18.5 is considered underweight. ? A BMI of 18.5 to 24.9 is normal. ? A BMI of 25 to 29.9 is considered overweight. ? A BMI of 30 and above is considered obese.   . Maintain normal blood lipids and cholesterol levels by exercising and minimizing your intake of saturated fat. Eat a balanced diet with plenty of fruit and vegetables. Blood tests for lipids  and cholesterol should begin at age 65 and be repeated every 5 years. If your lipid or cholesterol levels are high, you are over 50, or you are at high risk for heart disease, you may need your cholesterol levels checked more frequently. Ongoing high lipid and cholesterol levels should be treated with medicines if diet and exercise are not working.  . If you smoke, find out from your health care provider how to quit. If you do not use tobacco, please do not start.  . If you choose to drink alcohol, please do not consume more than 2 drinks per day. One drink is  considered to be 12 ounces (355 mL) of beer, 5 ounces (148 mL) of wine, or 1.5 ounces (44 mL) of liquor.  . If you are 31-68 years old, ask your health care provider if you should take aspirin to prevent strokes.  . Use sunscreen. Apply sunscreen liberally and repeatedly throughout the day. You should seek shade when your shadow is shorter than you. Protect yourself by wearing Connelly sleeves, pants, a wide-brimmed hat, and sunglasses year round, whenever you are outdoors.  . Once a month, do a whole body skin exam, using a mirror to look at the skin on your back. Tell your health care provider of new moles, moles that have irregular borders, moles that are larger than a pencil eraser, or moles that have changed in shape or color.

## 2015-10-15 NOTE — Progress Notes (Signed)
PCP notes  Health maintenance:   Hep C screening - completed  Abnormal screenings: None  Patient concerns: Pt has mole present to right abdomen below bra line. Pt states she has had mole since childhood but has noticed it has started "hardening". Pt denies pain, discharge, bleeding, or change in circumference and color. Pt would like mole assessed at next appt.   Nurse concerns: None  Next PCP appt: 10/21/15 @ 1100

## 2015-10-15 NOTE — Progress Notes (Signed)
Pre visit review using our clinic review tool, if applicable. No additional management support is needed unless otherwise documented below in the visit note. 

## 2015-10-15 NOTE — Progress Notes (Signed)
I reviewed health advisor's note, was available for consultation, and agree with documentation and plan.  

## 2015-10-15 NOTE — Progress Notes (Signed)
Subjective:   Kari Willis is a 65 y.o. female who presents for Medicare Annual (Subsequent) preventive examination.  Review of Systems:  N/A Cardiac Risk Factors include: advanced age (>61men, >55 women);dyslipidemia     Objective:     Vitals: BP 100/68 mmHg  Pulse 56  Temp(Src) 97.9 F (36.6 C) (Oral)  Ht 5' 0.5" (1.537 m)  Wt 128 lb 4 oz (58.174 kg)  BMI 24.63 kg/m2  SpO2 99%  Body mass index is 24.63 kg/(m^2).   Tobacco History  Smoking status  . Never Smoker   Smokeless tobacco  . Never Used     Counseling given: No   Past Medical History  Diagnosis Date  . Cancer [facility]) oct 2013    breast  . Eye pressure     "high eye pressure"  . Kidney stones   . Headache    Past Surgical History  Procedure Laterality Date  . Breast lumpectomy  dec 2013  . Breast biopsy    . Hernia repair     Family History  Problem Relation Age of Onset  . Cancer Mother   . Heart disease Father   . Heart disease Sister   . Heart disease Brother   . Stroke Paternal Aunt    History  Sexual Activity  . Sexual Activity: No    Outpatient Encounter Prescriptions as of 10/15/2015  Medication Sig  . aspirin 81 MG tablet Take 81 mg by mouth 3 (three) times a week.  . B Complex Vitamins (B COMPLEX-B12 PO) Take 1 tablet by mouth daily.  . Calcium Carbonate-Vitamin D (CALCIUM 600+D HIGH POTENCY PO) Take 1 tablet by mouth 2 (two) times daily.  . IRON, FERROUS SULFATE, PO Take 65 mg by mouth 2 (two) times a week.  . Multiple Vitamins-Minerals (CENTRUM SILVER ADULT 50+ PO) Take 1 tablet by mouth daily.  . niacin 500 MG tablet Take 500 mg by mouth at bedtime.  . Omega-3 Fatty Acids (FISH OIL) 1200 MG CAPS Take 1 capsule by mouth.  . timolol (BETIMOL) 0.5 % ophthalmic solution Place 1 drop into both eyes 2 (two) times daily.  . vitamin C (ASCORBIC ACID) 500 MG tablet Take 500 mg by mouth 2 (two) times daily.  . vitamin E 400 UNIT capsule Take 400 Units by mouth daily.  .  [DISCONTINUED] clindamycin (CLEOCIN) 300 MG capsule Take 1 capsule (300 mg total) by mouth 3 (three) times daily.  . [DISCONTINUED] Ferrous Gluconate (IRON) 240 (27 FE) MG TABS Take 1 tablet by mouth 2 (two) times a week.   No facility-administered encounter medications on file as of 10/15/2015.    Activities of Daily Living In your present state of health, do you have any difficulty performing the following activities: 10/15/2015  Hearing? N  Vision? N  Difficulty concentrating or making decisions? N  Walking or climbing stairs? N  Dressing or bathing? N  Doing errands, shopping? N  Preparing Food and eating ? N  Using the Toilet? N  In the past six months, have you accidently leaked urine? N  Do you have problems with loss of bowel control? N  Managing your Medications? N  Managing your Finances? N  Housekeeping or managing your Housekeeping? N    Patient Care Team: Lucille Passy, MD as PCP - General (Family Medicine) Lloyd Huger, MD as Consulting Physician (Oncology) Alphonsa Overall, MD as Consulting Physician (General Surgery) Lucious Groves, OD as Referring Physician (Optometry)    Assessment:  Hearing Screening   125Hz  250Hz  500Hz  1000Hz  2000Hz  4000Hz  8000Hz   Right ear:   40 40 40 40   Left ear:   40 40 40 40   Vision Screening Comments: Last vision exam with Dr. Zadie Rhine in Mar 2017   Exercise Activities and Dietary recommendations Current Exercise Habits: Home exercise routine;Structured exercise class, Type of exercise: walking;strength training/weights;Other - see comments (pilates), Time (Minutes): 30, Frequency (Times/Week): 3, Weekly Exercise (Minutes/Week): 90, Intensity: Moderate, Exercise limited by: None identified  Goals    . Increase physical activity     Starting 10/15/2015, I will continue to exercise for at least 30 min 3 days per week.       Fall Risk Fall Risk  10/15/2015 10/14/2014  Falls in the past year? No No   Depression Screen PHQ 2/9  Scores 10/15/2015 10/14/2014  PHQ - 2 Score 0 0     Cognitive Testing MMSE - Mini Mental State Exam 10/15/2015  Orientation to time 5  Orientation to Place 5  Registration 3  Attention/ Calculation 0  Recall 3  Language- name 2 objects 0  Language- repeat 1  Language- follow 3 step command 3  Language- read & follow direction 0  Write a sentence 0  Copy design 0  Total score 20   PLEASE NOTE: A Mini-Cog screen was completed. Maximum score is 20. A value of 0 denotes this part of Folstein MMSE was not completed or the patient failed this part of the Mini-Cog screening.   Mini-Cog Screening Orientation to Time - Max 5 pts Orientation to Place - Max 5 pts Registration - Max 3 pts Recall - Max 3 pts Language Repeat - Max 1 pts Language Follow 3 Step Command - Max 3 pts  Immunization History  Administered Date(s) Administered  . Influenza,inj,Quad PF,36+ Mos 02/12/2015  . Pneumococcal Conjugate-13 01/21/2015  . Tdap 02/12/2015  . Zoster 04/11/2013   Screening Tests Health Maintenance  Topic Date Due  . INFLUENZA VACCINE  11/03/2015  . PNA vac Low Risk Adult (2 of 2 - PPSV23) 01/21/2016  . MAMMOGRAM  03/17/2017  . COLONOSCOPY  02/09/2023  . TETANUS/TDAP  02/11/2025  . DEXA SCAN  Completed  . ZOSTAVAX  Completed  . Hepatitis C Screening  Completed      Plan:     I have personally reviewed and addressed the Medicare Annual Wellness questionnaire and have noted the following in the patient's chart:  A. Medical and social history B. Use of alcohol, tobacco or illicit drugs  C. Current medications and supplements D. Functional ability and status E.  Nutritional status F.  Physical activity G. Advance directives H. List of other physicians I.  Hospitalizations, surgeries, and ER visits in previous 12 months J.  [place] to include hearing, vision, cognitive, depression L. Referrals and appointments - none  In addition, I have reviewed and discussed with  patient certain preventive protocols, quality metrics, and best practice recommendations. A written personalized care plan for preventive services as well as general preventive health recommendations were provided to patient.  See attached scanned questionnaire for additional information.   Signed,   Lindell Noe, MHA, BS, LPN Health Advisor

## 2015-10-16 LAB — HEPATITIS C ANTIBODY: HCV Ab: NEGATIVE

## 2015-10-21 ENCOUNTER — Encounter: Payer: Self-pay | Admitting: Family Medicine

## 2015-10-21 ENCOUNTER — Ambulatory Visit (INDEPENDENT_AMBULATORY_CARE_PROVIDER_SITE_OTHER): Payer: Medicare Other | Admitting: Family Medicine

## 2015-10-21 VITALS — BP 110/60 | HR 58 | Temp 98.4°F | Wt 130.5 lb

## 2015-10-21 DIAGNOSIS — Z0000 Encounter for general adult medical examination without abnormal findings: Secondary | ICD-10-CM | POA: Diagnosis not present

## 2015-10-21 DIAGNOSIS — C801 Malignant (primary) neoplasm, unspecified: Secondary | ICD-10-CM | POA: Diagnosis not present

## 2015-10-21 DIAGNOSIS — E785 Hyperlipidemia, unspecified: Secondary | ICD-10-CM | POA: Diagnosis not present

## 2015-10-21 DIAGNOSIS — C50912 Malignant neoplasm of unspecified site of left female breast: Secondary | ICD-10-CM

## 2015-10-21 DIAGNOSIS — Z01419 Encounter for gynecological examination (general) (routine) without abnormal findings: Secondary | ICD-10-CM

## 2015-10-21 NOTE — Patient Instructions (Signed)
Great to see you. Your cholesterol looks great.  Keep up the great work!

## 2015-10-21 NOTE — Progress Notes (Signed)
Subjective:   Patient ID: Kari Willis, female    DOB: [birth year], 65 y.o.   MRN: [id number]  Kari Willis is a pleasant 66 y.o. year old female who established care with me in 09/2014, presents to clinic today with Annual Exam and Nevus  and follow up of chronic medical conditions on 10/21/2015  HPI:  Medicare wellness visit with Candis Musa, RN last week. Note reviewed.   Breast CA- left- s/p lumpectomy and radiation in 2013.  Has been seeing breast surgeon and oncology once a year. Per pt, mammogram UTD- fall of 2016  Raynauds- takes ASA which does seem to help.  Wears gloves as well.  HLD- Much improved!  Has been working on diet.  She takes Niacin and fish oil. Lab Results  Component Value Date   CHOL 201* 10/15/2015   HDL 47.60 10/15/2015   LDLCALC 129* 10/15/2015   TRIG 122.0 10/15/2015   CHOLHDL 4 10/15/2015   Lab Results  Component Value Date   CREATININE 0.93 10/15/2015   Lab Results  Component Value Date   WBC 5.3 10/15/2015   HGB 13.6 10/15/2015   HCT 40.8 10/15/2015   MCV 95.0 10/15/2015   PLT 197.0 10/15/2015   Lab Results  Component Value Date   NA 139 10/15/2015   K 5.0 10/15/2015   CL 104 10/15/2015   CO2 33* 10/15/2015   Lab Results  Component Value Date   TSH 2.16 10/15/2015    Current Outpatient Prescriptions on File Prior to Visit  Medication Sig Dispense Refill  . aspirin 81 MG tablet Take 81 mg by mouth 3 (three) times a week.    . B Complex Vitamins (B COMPLEX-B12 PO) Take 1 tablet by mouth daily.    . Calcium Carbonate-Vitamin D (CALCIUM 600+D HIGH POTENCY PO) Take 1 tablet by mouth 2 (two) times daily.    . IRON, FERROUS SULFATE, PO Take 65 mg by mouth 2 (two) times a week.    . Multiple Vitamins-Minerals (CENTRUM SILVER ADULT 50+ PO) Take 1 tablet by mouth daily.    . niacin 500 MG tablet Take 500 mg by mouth at bedtime.    . Omega-3 Fatty Acids (FISH OIL) 1200 MG CAPS Take 1 capsule by mouth.    . timolol (BETIMOL) 0.5 %  ophthalmic solution Place 1 drop into both eyes 2 (two) times daily.    . vitamin C (ASCORBIC ACID) 500 MG tablet Take 500 mg by mouth 2 (two) times daily.    . vitamin E 400 UNIT capsule Take 400 Units by mouth daily.     No current facility-administered medications on file prior to visit.    Allergies  Allergen Reactions  . Demerol [Meperidine]     "burning sensation throughout body"    Past Medical History  Diagnosis Date  . Cancer [facility]) oct 2013    breast  . Eye pressure     "high eye pressure"  . Kidney stones   . Headache     Past Surgical History  Procedure Laterality Date  . Breast lumpectomy  dec 2013  . Breast biopsy    . Hernia repair      Family History  Problem Relation Age of Onset  . Cancer Mother   . Heart disease Father   . Heart disease Sister   . Heart disease Brother   . Stroke Paternal Aunt     Social History   Social History  . Marital Status: Married    Spouse Name:  N/A  . Number of Children: N/A  . Years of Education: N/A   Occupational History  . Not on file.   Social History Main Topics  . Smoking status: Never Smoker   . Smokeless tobacco: Never Used  . Alcohol Use: Yes     Comment: one glass of wine every 6 months  . Drug Use: No  . Sexual Activity: No   Other Topics Concern  . Not on file   Social History Narrative   Married.   Retired from [employer] from [employer] in Fall 2015      She is a DNR- yellow sheet given to pt on 10/14/14   The PMH, PSH, Social History, Family History, Medications, and allergies have been reviewed in [facility], and have been updated if relevant.    Review of Systems  Constitutional: Negative.   HENT: Negative.   Eyes: Negative.   Respiratory: Negative.   Cardiovascular: Negative.   Gastrointestinal: Negative.   Endocrine: Negative.   Genitourinary: Negative.   Musculoskeletal: Negative.   Skin: Negative.   Allergic/Immunologic: Negative.   Neurological: Negative.   Hematological:  Negative.   Psychiatric/Behavioral: Negative.   All other systems reviewed and are negative.      Objective:    BP 110/60 mmHg  Pulse 58  Temp(Src) 98.4 F (36.9 C) (Oral)  Wt 130 lb 8 oz (59.194 kg)  SpO2 99%   Physical Exam  Constitutional: She is oriented to person, place, and time. She appears well-developed and well-nourished. No distress.  HENT:  Head: Normocephalic.  Eyes: Conjunctivae are normal.  Neck: Normal range of motion. Neck supple.  Cardiovascular: Normal rate, regular rhythm and normal heart sounds.   Pulmonary/Chest: Effort normal and breath sounds normal. No respiratory distress.  Abdominal: Soft. Bowel sounds are normal.  Musculoskeletal: Normal range of motion. She exhibits no edema.  Neurological: She is alert and oriented to person, place, and time. No cranial nerve deficit.  Skin: Skin is warm and dry.  Psychiatric: She has a normal mood and affect. Her behavior is normal. Judgment and thought content normal.  Nursing note and vitals reviewed.         Assessment & Plan:   Well woman exam  HLD (hyperlipidemia)  Malignant neoplasm of left female breast, unspecified site of breast ([place]) No Follow-up on file.

## 2015-10-21 NOTE — Progress Notes (Signed)
Pre visit review using our clinic review tool, if applicable. No additional management support is needed unless otherwise documented below in the visit note. 

## 2015-10-21 NOTE — Assessment & Plan Note (Signed)
Mammogram UTD and has follow up already scheduled with oncology.

## 2015-10-21 NOTE — Assessment & Plan Note (Signed)
Congratulated her on improved cholesterol! No changes made today.

## 2015-10-21 NOTE — Assessment & Plan Note (Signed)
Reviewed preventive care protocols, scheduled due services, and updated immunizations Discussed nutrition, exercise, diet, and healthy lifestyle.  

## 2015-11-17 ENCOUNTER — Inpatient Hospital Stay: Payer: Medicare Other

## 2015-11-17 ENCOUNTER — Inpatient Hospital Stay: Payer: Medicare Other | Attending: Oncology | Admitting: Oncology

## 2015-11-17 VITALS — BP 122/73 | HR 59 | Temp 98.1°F | Resp 18 | Wt 130.3 lb

## 2015-11-17 DIAGNOSIS — Z171 Estrogen receptor negative status [ER-]: Secondary | ICD-10-CM | POA: Diagnosis not present

## 2015-11-17 DIAGNOSIS — Z923 Personal history of irradiation: Secondary | ICD-10-CM

## 2015-11-17 DIAGNOSIS — Z853 Personal history of malignant neoplasm of breast: Secondary | ICD-10-CM | POA: Diagnosis not present

## 2015-11-17 DIAGNOSIS — Z79899 Other long term (current) drug therapy: Secondary | ICD-10-CM

## 2015-11-17 DIAGNOSIS — D0512 Intraductal carcinoma in situ of left breast: Secondary | ICD-10-CM

## 2015-11-17 LAB — CBC WITH DIFFERENTIAL/PLATELET
Basophils Absolute: 0.1 10*3/uL (ref 0–0.1)
Basophils Relative: 2 %
Eosinophils Absolute: 0.2 10*3/uL (ref 0–0.7)
Eosinophils Relative: 5 %
HEMATOCRIT: 39.7 % (ref 35.0–47.0)
HEMOGLOBIN: 13.5 g/dL (ref 12.0–16.0)
LYMPHS ABS: 1.6 10*3/uL (ref 1.0–3.6)
Lymphocytes Relative: 37 %
MCH: 32.3 pg (ref 26.0–34.0)
MCHC: 34 g/dL (ref 32.0–36.0)
MCV: 94.8 fL (ref 80.0–100.0)
MONO ABS: 0.4 10*3/uL (ref 0.2–0.9)
MONOS PCT: 8 %
NEUTROS ABS: 2.1 10*3/uL (ref 1.4–6.5)
NEUTROS PCT: 48 %
Platelets: 168 10*3/uL (ref 150–440)
RBC: 4.18 MIL/uL (ref 3.80–5.20)
RDW: 12.9 % (ref 11.5–14.5)
WBC: 4.3 10*3/uL (ref 3.6–11.0)

## 2015-11-17 LAB — HEPATIC FUNCTION PANEL
ALK PHOS: 49 U/L (ref 38–126)
ALT: 25 U/L (ref 14–54)
AST: 24 U/L (ref 15–41)
Albumin: 4.3 g/dL (ref 3.5–5.0)
BILIRUBIN TOTAL: 1 mg/dL (ref 0.3–1.2)
Total Protein: 7.2 g/dL (ref 6.5–8.1)

## 2015-11-17 LAB — CREATININE, SERUM
Creatinine, Ser: 0.89 mg/dL (ref 0.44–1.00)
GFR calc Af Amer: >60 mL/min (ref >60)

## 2015-11-17 NOTE — Progress Notes (Signed)
States is feeling well. Offers no complaints. 

## 2015-11-19 NOTE — Progress Notes (Signed)
[place]  [phone] [phone]) [phone]  ID: Bunny Hort OB: 02-21-1951  MR#: [id number]  [id number]  Patient Care Team: Lucille Passy, MD as PCP - General (Family Medicine) Lloyd Huger, MD as Consulting Physician (Oncology) Alphonsa Overall, MD as Consulting Physician (General Surgery) Lucious Groves, OD as Referring Physician (Optometry)  CHIEF COMPLAINT: DCIS, left breast.   INTERVAL HISTORY: Patient returns to clinic for routine yearly follow-up. She continues to feel well and remains asymptomatic. She has no neurological complaints. She denies any pain. She has a good appetite and denies weight loss. She has no chest pain or shortness of breath. She denies any nausea, vomiting, constipation, or diarrhea. She has no urinary complaints. Patient feels at her baseline and offers no specific complaints today.  REVIEW OF SYSTEMS:   Review of Systems  Constitutional: Negative for fever, malaise/fatigue and weight loss.  Respiratory: Negative.  Negative for cough and shortness of breath.   Cardiovascular: Negative.  Negative for chest pain.  Gastrointestinal: Negative.  Negative for abdominal pain.  Genitourinary: Negative.   Musculoskeletal: Negative.   Neurological: Negative.  Negative for sensory change and weakness.  Psychiatric/Behavioral: Negative.  The patient is not nervous/anxious.     As per HPI. Otherwise, a complete review of systems is negatve.  PAST MEDICAL HISTORY: Past Medical History:  Diagnosis Date  . Cancer [facility]) oct 2013   breast  . Eye pressure    "high eye pressure"  . Headache   . Kidney stones     PAST SURGICAL HISTORY: Past Surgical History:  Procedure Laterality Date  . BREAST BIOPSY    . BREAST LUMPECTOMY  dec 2013  . HERNIA REPAIR      FAMILY HISTORY: Family History  Problem Relation Age of Onset  . Cancer Mother   . Heart disease Father   . Heart disease Sister   . Heart disease Brother   .  Stroke Paternal Aunt        ADVANCED DIRECTIVES:    HEALTH MAINTENANCE: Social History  Substance Use Topics  . Smoking status: Never Smoker  . Smokeless tobacco: Never Used  . Alcohol use Yes     Comment: one glass of wine every 6 months     Colonoscopy:  PAP:  Bone density:  Lipid panel:  Allergies  Allergen Reactions  . Demerol [Meperidine]     "burning sensation throughout body"    Current Outpatient Prescriptions  Medication Sig Dispense Refill  . aspirin 81 MG tablet Take 81 mg by mouth 3 (three) times a week.    . B Complex Vitamins (B COMPLEX-B12 PO) Take 1 tablet by mouth daily.    . Calcium Carbonate-Vitamin D (CALCIUM 600+D HIGH POTENCY PO) Take 1 tablet by mouth 2 (two) times daily.    . IRON, FERROUS SULFATE, PO Take 65 mg by mouth 2 (two) times a week.    . Multiple Vitamins-Minerals (CENTRUM SILVER ADULT 50+ PO) Take 1 tablet by mouth daily.    . niacin 500 MG tablet Take 500 mg by mouth at bedtime.    . Omega-3 Fatty Acids (FISH OIL) 1200 MG CAPS Take 1 capsule by mouth.    . timolol (BETIMOL) 0.5 % ophthalmic solution Place 1 drop into both eyes 2 (two) times daily.    . vitamin C (ASCORBIC ACID) 500 MG tablet Take 500 mg by mouth 2 (two) times daily.    . vitamin E 400 UNIT capsule Take 400 Units by mouth daily.  No current facility-administered medications for this visit.     OBJECTIVE: Vitals:   11/17/15 1013  BP: 122/73  Pulse: (!) 59  Resp: 18  Temp: 98.1 F (36.7 C)     Body mass index is 25.03 kg/m.    ECOG FS:0 - Asymptomatic  General: Well-developed, well-nourished, no acute distress. Eyes: Pink conjunctiva, anicteric sclera. Breasts: Patient declined breast exam today. Lungs: Clear to auscultation bilaterally. Heart: Regular rate and rhythm. No rubs, murmurs, or gallops. Abdomen: Soft, nontender, nondistended. No organomegaly noted, normoactive bowel sounds. Musculoskeletal: No edema, cyanosis, or clubbing. Neuro: Alert,  answering all questions appropriately. Cranial nerves grossly intact. Skin: No rashes or petechiae noted. Psych: Normal affect.   LAB RESULTS:  Lab Results  Component Value Date   NA 139 10/15/2015   K 5.0 10/15/2015   CL 104 10/15/2015   CO2 33 (H) 10/15/2015   GLUCOSE 104 (H) 10/15/2015   BUN 8 10/15/2015   CREATININE 0.89 11/17/2015   CALCIUM 10.0 10/15/2015   PROT 7.2 11/17/2015   ALBUMIN 4.3 11/17/2015   AST 24 11/17/2015   ALT 25 11/17/2015   ALKPHOS 49 11/17/2015   BILITOT 1.0 11/17/2015   GFRNONAA >60 11/17/2015   GFRAA >60 11/17/2015    Lab Results  Component Value Date   WBC 4.3 11/17/2015   NEUTROABS 2.1 11/17/2015   HGB 13.5 11/17/2015   HCT 39.7 11/17/2015   MCV 94.8 11/17/2015   PLT 168 11/17/2015     STUDIES: No results found.  ASSESSMENT: Left breast DCIS, ER/PR negative.  PLAN:    1. Left breast DCIS: Patient underwent lumpectomy in December 2013 in [place] and completed adjuvant XRT in March 2014. Her DCIS was also noted to be ER/PR negative, therefore tamoxifen would not offer additional benefit. No intervention is needed at this time. Patient's most recent mammogram on March 18, 2015 was reported as BI-RADS 2, repeat in December 2017. Return to clinic in 1 year for routine evaluation. Once patient is 5 years removed from completing her adjuvant XRT she likely can be discharged from clinic.  Patient expressed understanding and was in agreement with this plan. She also understands that She can call clinic at any time with any questions, concerns, or complaints.   Ductal carcinoma in situ (DCIS) of left breast   Staging form: Breast, AJCC 7th Edition   - Clinical stage from 11/16/2015: Stage 0 (Tis (DCIS), N0, M0) - Signed by Lloyd Huger, MD on 11/19/2015  Lloyd Huger, MD   11/19/2015 8:32 AM

## 2016-03-21 ENCOUNTER — Ambulatory Visit
Admission: RE | Admit: 2016-03-21 | Discharge: 2016-03-21 | Disposition: A | Discharge status: Home / Self Care | Payer: Medicare Other | Source: Ambulatory Visit | Attending: Oncology | Admitting: Oncology

## 2016-03-21 ENCOUNTER — Other Ambulatory Visit: Payer: Medicare Other

## 2016-03-21 ENCOUNTER — Ambulatory Visit: Payer: Medicare Other

## 2016-03-21 DIAGNOSIS — D0512 Intraductal carcinoma in situ of left breast: Secondary | ICD-10-CM

## 2016-06-23 DIAGNOSIS — Z853 Personal history of malignant neoplasm of breast: Secondary | ICD-10-CM | POA: Diagnosis not present

## 2016-07-26 DIAGNOSIS — H40053 Ocular hypertension, bilateral: Secondary | ICD-10-CM | POA: Diagnosis not present

## 2016-07-26 DIAGNOSIS — H2513 Age-related nuclear cataract, bilateral: Secondary | ICD-10-CM | POA: Diagnosis not present

## 2016-07-26 DIAGNOSIS — H40013 Open angle with borderline findings, low risk, bilateral: Secondary | ICD-10-CM | POA: Diagnosis not present

## 2016-07-26 DIAGNOSIS — H524 Presbyopia: Secondary | ICD-10-CM | POA: Diagnosis not present

## 2016-11-14 ENCOUNTER — Other Ambulatory Visit: Payer: Self-pay | Admitting: Surgery

## 2016-11-14 DIAGNOSIS — Z853 Personal history of malignant neoplasm of breast: Secondary | ICD-10-CM

## 2016-11-14 NOTE — Progress Notes (Signed)
[place]  Telephone:([phone] [phone]) [phone]  ID: Kari Willis OB: 26-Jan-1951  MR#: [id number]  BPZ#:[id number]  Patient Care Team: Lucille Passy, MD as PCP - General (Family Medicine) Lloyd Huger, MD as Consulting Physician (Oncology) Alphonsa Overall, MD as Consulting Physician (General Surgery) Lucious Groves, OD as Referring Physician (Optometry)  CHIEF COMPLAINT: ER/PR negative DCIS, left breast.   INTERVAL HISTORY: Patient returns to clinic for routine yearly follow-up. She continues to feel well and remains asymptomatic. She has no neurological complaints. She denies any pain. She has a good appetite and denies weight loss. She has no chest pain or shortness of breath. She denies any nausea, vomiting, constipation, or diarrhea. She has no urinary complaints. Patient feels at her baseline and offers no specific complaints today.  REVIEW OF SYSTEMS:   Review of Systems  Constitutional: Negative for fever, malaise/fatigue and weight loss.  Respiratory: Negative.  Negative for cough and shortness of breath.   Cardiovascular: Negative.  Negative for chest pain and leg swelling.  Gastrointestinal: Negative.  Negative for abdominal pain.  Genitourinary: Negative.   Musculoskeletal: Negative.   Skin: Negative.  Negative for rash.  Neurological: Negative.  Negative for sensory change and weakness.  Psychiatric/Behavioral: Negative.  The patient is not nervous/anxious.     As per HPI. Otherwise, a complete review of systems is negative.  PAST MEDICAL HISTORY: Past Medical History:  Diagnosis Date  . Cancer [facility]) oct 2013   breast  . Eye pressure    "high eye pressure"  . Headache   . Kidney stones     PAST SURGICAL HISTORY: Past Surgical History:  Procedure Laterality Date  . BREAST BIOPSY    . BREAST LUMPECTOMY  dec 2013  . HERNIA REPAIR      FAMILY HISTORY: Family History  Problem Relation Age of Onset  . Cancer Mother   . Heart  disease Father   . Heart disease Sister   . Heart disease Brother   . Stroke Paternal Aunt        ADVANCED DIRECTIVES:    HEALTH MAINTENANCE: Social History  Substance Use Topics  . Smoking status: Never Smoker  . Smokeless tobacco: Never Used  . Alcohol use Yes     Comment: one glass of wine every 6 months     Colonoscopy:  PAP:  Bone density:  Lipid panel:  Allergies  Allergen Reactions  . Demerol [Meperidine]     "burning sensation throughout body"    Current Outpatient Prescriptions  Medication Sig Dispense Refill  . aspirin 81 MG tablet Take 81 mg by mouth 3 (three) times a week.    . B Complex Vitamins (B COMPLEX-B12 PO) Take 1 tablet by mouth daily.    . Calcium Carbonate-Vitamin D (CALCIUM 600+D HIGH POTENCY PO) Take 1 tablet by mouth 2 (two) times daily.    . IRON, FERROUS SULFATE, PO Take 65 mg by mouth 2 (two) times a week.    . Multiple Vitamins-Minerals (CENTRUM SILVER ADULT 50+ PO) Take 1 tablet by mouth daily.    . niacin 500 MG tablet Take 500 mg by mouth at bedtime.    . Omega-3 Fatty Acids (FISH OIL) 1200 MG CAPS Take 1 capsule by mouth.    . timolol (BETIMOL) 0.5 % ophthalmic solution Place 1 drop into both eyes 2 (two) times daily.    . vitamin C (ASCORBIC ACID) 500 MG tablet Take 500 mg by mouth 2 (two) times daily.    Marland Kitchen  vitamin E 400 UNIT capsule Take 400 Units by mouth daily.     No current facility-administered medications for this visit.     OBJECTIVE: Vitals:   11/16/16 1057  BP: 115/75  Pulse: (!) 58  Resp: 20  Temp: (!) 96.4 F (35.8 C)     Body mass index is 25.66 kg/m.    ECOG FS:0 - Asymptomatic  General: Well-developed, well-nourished, no acute distress. Eyes: Pink conjunctiva, anicteric sclera. Breasts: Patient declined breast exam today. Lungs: Clear to auscultation bilaterally. Heart: Regular rate and rhythm. No rubs, murmurs, or gallops. Abdomen: Soft, nontender, nondistended. No organomegaly noted, normoactive bowel  sounds. Musculoskeletal: No edema, cyanosis, or clubbing. Neuro: Alert, answering all questions appropriately. Cranial nerves grossly intact. Skin: No rashes or petechiae noted. Psych: Normal affect.   LAB RESULTS:  Lab Results  Component Value Date   NA 139 10/15/2015   K 5.0 10/15/2015   CL 104 10/15/2015   CO2 33 (H) 10/15/2015   GLUCOSE 104 (H) 10/15/2015   BUN 8 10/15/2015   CREATININE 0.89 11/17/2015   CALCIUM 10.0 10/15/2015   PROT 7.2 11/17/2015   ALBUMIN 4.3 11/17/2015   AST 24 11/17/2015   ALT 25 11/17/2015   ALKPHOS 49 11/17/2015   BILITOT 1.0 11/17/2015   GFRNONAA >60 11/17/2015   GFRAA >60 11/17/2015    Lab Results  Component Value Date   WBC 4.3 11/17/2015   NEUTROABS 2.1 11/17/2015   HGB 13.5 11/17/2015   HCT 39.7 11/17/2015   MCV 94.8 11/17/2015   PLT 168 11/17/2015     STUDIES: No results found.  ASSESSMENT: Left breast DCIS, ER/PR negative.  PLAN:    1. Left breast DCIS: Patient underwent lumpectomy in December 2013 in [place] and completed adjuvant XRT in March 2014. Her DCIS was also noted to be ER/PR negative, therefore tamoxifen would not offer additional benefit. No intervention is needed at this time. Patient's most recent mammogram on March 21, 2016 was reported as BI-RADS 2, repeat in December 2018. Patient is now nearly 5 years removed from completing her XRT and can be discharged from clinic. She reports she continues to have yearly follow-up with a Psychologist, sport and exercise in [address], [place]. Please refer patient back if there are any questions or concerns.   Approximately 20 minutes was spent in discussion of which greater than 50% was consultation.  Patient expressed understanding and was in agreement with this plan. She also understands that She can call clinic at any time with any questions, concerns, or complaints.   Cancer Staging Ductal carcinoma in situ (DCIS) of left breast Staging form: Breast, AJCC 7th Edition - Clinical  stage from 11/16/2015: Stage 0 (Tis (DCIS), N0, M0) - Signed by Lloyd Huger, MD on 11/19/2015   Lloyd Huger, MD   11/18/2016 9:59 AM

## 2016-11-16 ENCOUNTER — Inpatient Hospital Stay: Payer: Medicare Other | Attending: Internal Medicine | Admitting: Oncology

## 2016-11-16 VITALS — BP 115/75 | HR 58 | Temp 96.4°F | Resp 20 | Wt 133.6 lb

## 2016-11-16 DIAGNOSIS — Z853 Personal history of malignant neoplasm of breast: Secondary | ICD-10-CM | POA: Insufficient documentation

## 2016-11-16 DIAGNOSIS — Z7982 Long term (current) use of aspirin: Secondary | ICD-10-CM | POA: Insufficient documentation

## 2016-11-16 DIAGNOSIS — Z87442 Personal history of urinary calculi: Secondary | ICD-10-CM | POA: Diagnosis not present

## 2016-11-16 DIAGNOSIS — Z923 Personal history of irradiation: Secondary | ICD-10-CM

## 2016-11-16 DIAGNOSIS — Z171 Estrogen receptor negative status [ER-]: Secondary | ICD-10-CM | POA: Diagnosis not present

## 2016-11-16 DIAGNOSIS — D0512 Intraductal carcinoma in situ of left breast: Secondary | ICD-10-CM

## 2016-11-16 NOTE — Progress Notes (Signed)
Patient denies any concerns today.  

## 2017-01-25 DIAGNOSIS — H40053 Ocular hypertension, bilateral: Secondary | ICD-10-CM | POA: Diagnosis not present

## 2017-01-25 DIAGNOSIS — H401131 Primary open-angle glaucoma, bilateral, mild stage: Secondary | ICD-10-CM | POA: Diagnosis not present

## 2017-01-25 DIAGNOSIS — H2513 Age-related nuclear cataract, bilateral: Secondary | ICD-10-CM | POA: Diagnosis not present

## 2017-03-22 ENCOUNTER — Ambulatory Visit
Admission: RE | Admit: 2017-03-22 | Discharge: 2017-03-22 | Disposition: A | Discharge status: Home / Self Care | Payer: Medicare Other | Source: Ambulatory Visit | Attending: Surgery | Admitting: Surgery

## 2017-03-22 DIAGNOSIS — R922 Inconclusive mammogram: Secondary | ICD-10-CM | POA: Diagnosis not present

## 2017-03-22 DIAGNOSIS — Z853 Personal history of malignant neoplasm of breast: Secondary | ICD-10-CM

## 2017-06-22 DIAGNOSIS — Z853 Personal history of malignant neoplasm of breast: Secondary | ICD-10-CM | POA: Diagnosis not present

## 2017-07-26 DIAGNOSIS — H524 Presbyopia: Secondary | ICD-10-CM | POA: Diagnosis not present

## 2017-07-26 DIAGNOSIS — H40003 Preglaucoma, unspecified, bilateral: Secondary | ICD-10-CM | POA: Diagnosis not present

## 2017-07-26 DIAGNOSIS — H40053 Ocular hypertension, bilateral: Secondary | ICD-10-CM | POA: Diagnosis not present

## 2017-07-26 DIAGNOSIS — H2513 Age-related nuclear cataract, bilateral: Secondary | ICD-10-CM | POA: Diagnosis not present

## 2017-09-13 ENCOUNTER — Encounter: Payer: Self-pay | Admitting: Internal Medicine

## 2017-09-13 ENCOUNTER — Ambulatory Visit (INDEPENDENT_AMBULATORY_CARE_PROVIDER_SITE_OTHER): Payer: Medicare Other | Admitting: Internal Medicine

## 2017-09-13 VITALS — BP 118/72 | HR 78 | Temp 99.2°F | Wt 133.0 lb

## 2017-09-13 DIAGNOSIS — R05 Cough: Secondary | ICD-10-CM | POA: Diagnosis not present

## 2017-09-13 DIAGNOSIS — J029 Acute pharyngitis, unspecified: Secondary | ICD-10-CM | POA: Diagnosis not present

## 2017-09-13 DIAGNOSIS — R059 Cough, unspecified: Secondary | ICD-10-CM

## 2017-09-13 NOTE — Patient Instructions (Signed)

## 2017-09-13 NOTE — Progress Notes (Signed)
HPI  Pt presents to the clinic today with c/o sore throat and cough. She reports this started 3-4 days ago. She denies difficulty swallowing. The cough is non productive. She denies runny nose, nasal congestion, ear pain or shortness of breath. She has run low grade fevers but had chills and body aches. She has tried a [employer] and Robitussin with some relief. She has no history of allergies .She has not had sick contacts.  Review of Systems      Past Medical History:  Diagnosis Date  . Cancer [facility]) oct 2013   breast  . Eye pressure    "high eye pressure"  . Headache   . Kidney stones     Family History  Problem Relation Age of Onset  . Cancer Mother   . Heart disease Father   . Heart disease Sister   . Heart disease Brother   . Stroke Paternal Aunt     Social History   Socioeconomic History  . Marital status: Married    Spouse name: Not on file  . Number of children: Not on file  . Years of education: Not on file  . Highest education level: Not on file  Occupational History  . Not on file  Social Needs  . Financial resource strain: Not on file  . Food insecurity:    Worry: Not on file    Inability: Not on file  . Transportation needs:    Medical: Not on file    Non-medical: Not on file  Tobacco Use  . Smoking status: Never Smoker  . Smokeless tobacco: Never Used  Substance and Sexual Activity  . Alcohol use: Yes    Comment: one glass of wine every 6 months  . Drug use: No  . Sexual activity: Never  Lifestyle  . Physical activity:    Days per week: Not on file    Minutes per session: Not on file  . Stress: Not on file  Relationships  . Social connections:    Talks on phone: Not on file    Gets together: Not on file    Attends religious service: Not on file    Active member of club or organization: Not on file    Attends meetings of clubs or organizations: Not on file    Relationship status: Not on file  . Intimate partner violence:    Fear of current  or ex partner: Not on file    Emotionally abused: Not on file    Physically abused: Not on file    Forced sexual activity: Not on file  Other Topics Concern  . Not on file  Social History Narrative   Married.   Retired from [employer] from [employer] in Fall 2015      She is a DNR- yellow sheet given to pt on 10/14/14    Allergies  Allergen Reactions  . Demerol [Meperidine]     "burning sensation throughout body"     Constitutional: Positive fever. Denies headache, fatigue, abrupt weight changes.  HEENT:  Positive sore throat. Denies eye redness, eye pain, pressure behind the eyes, facial pain, nasal congestion, ear pain, ringing in the ears, wax buildup, runny nose or bloody nose. Respiratory: Positive cough. Denies difficulty breathing or shortness of breath.  Cardiovascular: Denies chest pain, chest tightness, palpitations or swelling in the hands or feet.   No other specific complaints in a complete review of systems (except as listed in HPI above).  Objective:  BP 118/72   Pulse 78   Temp 99.2 F (37.3 C) (Oral)   Wt 133 lb (60.3 kg)   SpO2 98%   BMI 25.55 kg/m  Wt Readings from Last 3 Encounters:  09/13/17 133 lb (60.3 kg)  11/16/16 133 lb 9.6 oz (60.6 kg)  11/17/15 130 lb 4.7 oz (59.1 kg)     General: Appears his stated age, well developed, well nourished in NAD. HEENT: Head: normal shape and size;  Ears: Tm's gray and intact, normal light reflex; Throat/Mouth: + PND. Teeth present, mucosa  pink and moist, no exudate noted, no lesions or ulcerations noted.  Neck: No cervical lymphadenopathy.  Cardiovascular: Normal rate and rhythm. S1,S2 noted.  No murmur, rubs or gallops noted.  Pulmonary/Chest: Normal effort and positive vesicular breath sounds. No respiratory distress. No wheezes, rales or ronchi noted.       Assessment & Plan:   Sore Throat, Cough:  Viral vs allergy Get some rest and drink plenty of water Do salt water gargles for the sore  throat Start Claritin daily Continue Robitussin for cough  RTC as needed or if symptoms persist.   Webb Silversmith, NP

## 2017-10-12 ENCOUNTER — Encounter: Payer: Medicare Other | Admitting: Internal Medicine

## 2017-10-17 ENCOUNTER — Encounter: Payer: Self-pay | Admitting: Internal Medicine

## 2017-10-17 ENCOUNTER — Ambulatory Visit (INDEPENDENT_AMBULATORY_CARE_PROVIDER_SITE_OTHER): Payer: Medicare Other | Admitting: Internal Medicine

## 2017-10-17 VITALS — BP 116/74 | HR 55 | Temp 98.0°F | Ht 60.5 in | Wt 131.0 lb

## 2017-10-17 DIAGNOSIS — D0512 Intraductal carcinoma in situ of left breast: Secondary | ICD-10-CM | POA: Diagnosis not present

## 2017-10-17 DIAGNOSIS — E7800 Pure hypercholesterolemia, unspecified: Secondary | ICD-10-CM | POA: Diagnosis not present

## 2017-10-17 DIAGNOSIS — N200 Calculus of kidney: Secondary | ICD-10-CM | POA: Diagnosis not present

## 2017-10-17 DIAGNOSIS — E559 Vitamin D deficiency, unspecified: Secondary | ICD-10-CM

## 2017-10-17 DIAGNOSIS — I7300 Raynaud's syndrome without gangrene: Secondary | ICD-10-CM | POA: Diagnosis not present

## 2017-10-17 LAB — CBC
HCT: 41.6 % (ref 36.0–46.0)
Hemoglobin: 14 g/dL (ref 12.0–15.0)
MCHC: 33.7 g/dL (ref 30.0–36.0)
MCV: 96.1 fl (ref 78.0–100.0)
Platelets: 185 10*3/uL (ref 150.0–400.0)
RBC: 4.33 Mil/uL (ref 3.87–5.11)
RDW: 12.8 % (ref 11.5–15.5)
WBC: 4.9 10*3/uL (ref 4.0–10.5)

## 2017-10-17 LAB — COMPREHENSIVE METABOLIC PANEL
ALT: 18 U/L (ref 0–35)
AST: 17 U/L (ref 0–37)
Albumin: 4.4 g/dL (ref 3.5–5.2)
Alkaline Phosphatase: 46 U/L (ref 39–117)
BILIRUBIN TOTAL: 0.9 mg/dL (ref 0.2–1.2)
BUN: 17 mg/dL (ref 6–23)
CO2: 30 meq/L (ref 19–32)
CREATININE: 0.9 mg/dL (ref 0.40–1.20)
Calcium: 9.7 mg/dL (ref 8.4–10.5)
Chloride: 104 mEq/L (ref 96–112)
GFR: 66.11 mL/min (ref >60.00)
Glucose, Bld: 110 mg/dL — ABNORMAL HIGH (ref 70–99)
Potassium: 5 mEq/L (ref 3.5–5.1)
Sodium: 139 mEq/L (ref 135–145)
Total Protein: 7.2 g/dL (ref 6.0–8.3)

## 2017-10-17 LAB — LIPID PANEL
CHOL/HDL RATIO: 4
Cholesterol: 232 mg/dL — ABNORMAL HIGH (ref 0–200)
HDL: 57.2 mg/dL (ref >39.00)
LDL Cholesterol: 153 mg/dL — ABNORMAL HIGH (ref 0–99)
NonHDL: 175.07
TRIGLYCERIDES: 110 mg/dL (ref 0.0–149.0)
VLDL: 22 mg/dL (ref 0.0–40.0)

## 2017-10-17 LAB — VITAMIN D 25 HYDROXY (VIT D DEFICIENCY, FRACTURES): VITD: 56.55 ng/mL (ref 30.00–100.00)

## 2017-10-17 NOTE — Assessment & Plan Note (Signed)
In remission She will continue to follow with oncology 

## 2017-10-17 NOTE — Progress Notes (Signed)
HPI  Pt presents to the clinic today to establish care and for management of the conditions listed below. She is transferring care from Dr. Deborra Medina.  History of Breast Cancer: s/p lumpectomy and radiation. She follows with Dr. Grayland Ormond.  History of Kidney Stones: More than 20 years ago.  HLD: Her last LDL was 129, 10/2015. She is taking Fish Oil and Niacin as prescribed. She tries to consume a low fat diet.   Iron Deficiency Anemia: Her last H/H were 13.5/39/7. She takes Ferrous Sulfate as prescribed.  Elevated Eye Pressure: She takes Timolol as prescribed. She follows with.  Raynaud's: She is not taking any medication for this. She reports she "just deals with it".  Flu: 01/2017 Tetanus: 02/2015 Pneumovax: never Prevnar: 01/2015 Zostovax: 04/2013 Shingrix: never Pap Smear: 2015 Mammogram: 03/2017 Colon Screening: 2014 Bone Density: 2014 Vision Screening: biannually Dentist: biannually  Past Medical History:  Diagnosis Date  . Cancer [facility]) oct 2013   breast  . Eye pressure    "high eye pressure"  . Headache   . Kidney stones     Current Outpatient Medications  Medication Sig Dispense Refill  . aspirin 81 MG tablet Take 81 mg by mouth 3 (three) times a week.    . B Complex Vitamins (B COMPLEX-B12 PO) Take 1 tablet by mouth daily.    . Calcium Carbonate-Vitamin D (CALCIUM 600+D HIGH POTENCY PO) Take 1 tablet by mouth 2 (two) times daily.    . IRON, FERROUS SULFATE, PO Take 65 mg by mouth 2 (two) times a week.    . Multiple Vitamins-Minerals (CENTRUM SILVER ADULT 50+ PO) Take 1 tablet by mouth daily.    . niacin 500 MG tablet Take 500 mg by mouth at bedtime.    . Omega-3 Fatty Acids (FISH OIL) 1200 MG CAPS Take 1 capsule by mouth.    . timolol (BETIMOL) 0.5 % ophthalmic solution Place 1 drop into both eyes 2 (two) times daily.    . vitamin C (ASCORBIC ACID) 500 MG tablet Take 500 mg by mouth 2 (two) times daily.    . vitamin E 400 UNIT capsule Take 400 Units by mouth daily.      No current facility-administered medications for this visit.     Allergies  Allergen Reactions  . Demerol [Meperidine]     "burning sensation throughout body"    Family History  Problem Relation Age of Onset  . Cancer Mother   . Heart disease Father   . Heart disease Sister   . Heart disease Brother   . Stroke Paternal Aunt     Social History   Socioeconomic History  . Marital status: Married    Spouse name: Not on file  . Number of children: Not on file  . Years of education: Not on file  . Highest education level: Not on file  Occupational History  . Not on file  Social Needs  . Financial resource strain: Not on file  . Food insecurity:    Worry: Not on file    Inability: Not on file  . Transportation needs:    Medical: Not on file    Non-medical: Not on file  Tobacco Use  . Smoking status: Never Smoker  . Smokeless tobacco: Never Used  Substance and Sexual Activity  . Alcohol use: Yes    Comment: one glass of wine every 6 months  . Drug use: No  . Sexual activity: Never  Lifestyle  . Physical activity:    Days per  week: Not on file    Minutes per session: Not on file  . Stress: Not on file  Relationships  . Social connections:    Talks on phone: Not on file    Gets together: Not on file    Attends religious service: Not on file    Active member of club or organization: Not on file    Attends meetings of clubs or organizations: Not on file    Relationship status: Not on file  . Intimate partner violence:    Fear of current or ex partner: Not on file    Emotionally abused: Not on file    Physically abused: Not on file    Forced sexual activity: Not on file  Other Topics Concern  . Not on file  Social History Narrative   Married.   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  HEENT:  Denies eye pain, eye redness, ear pain, ringing in the ears, wax buildup, runny nose, nasal congestion, bloody nose, or sore throat. Respiratory: Denies difficulty breathing, shortness of breath, cough or sputum production.   Cardiovascular: Denies chest pain, chest tightness, palpitations or swelling in the hands or feet.  Gastrointestinal: Denies abdominal pain, bloating, constipation, diarrhea or blood in the stool.  GU: Denies frequency, urgency, pain with urination, blood in urine, odor or discharge. Musculoskeletal: Denies decrease in range of motion, difficulty with gait, muscle pain or joint pain and swelling.  Skin: Denies redness, rashes, lesions or ulcercations.  Neurological: Denies dizziness, difficulty with memory, difficulty with speech or problems with balance and coordination.  Psych: Denies anxiety, depression, SI/HI.  No other specific complaints in a complete review of systems (except as listed in HPI above).  PE:  BP 116/74   Pulse (!) 55   Temp 98 F (36.7 C) (Oral)   Ht 5' 0.5" (1.537 m)   Wt 131 lb (59.4 kg)   SpO2 98%   BMI 25.16 kg/m   Wt Readings from Last 3 Encounters:  09/13/17 133 lb (60.3 kg)  11/16/16 133 lb 9.6 oz (60.6 kg)  11/17/15 130 lb 4.7 oz (59.1 kg)    General: Appears her stated age, well developed, well nourished in NAD. Cardiovascular: Normal rate and rhythm. S1,S2 noted.  No murmur, rubs or gallops noted. No JVD or BLE edema. No carotid bruits noted. Pulmonary/Chest: Normal effort and positive vesicular breath sounds. No respiratory distress. No wheezes, rales or ronchi noted.  Musculoskeletal:  No difficulty with gait.  Neurological: Alert and oriented.  Psychiatric: Mood and affect normal. Behavior is normal. Judgment and thought content normal.     BMET    Component Value Date/Time   NA 139 10/15/2015 0851   K 5.0 10/15/2015 0851   CL 104 10/15/2015 0851   CO2 33 (H) 10/15/2015 0851   GLUCOSE 104 (H) 10/15/2015 0851   BUN  8 10/15/2015 0851   CREATININE 0.89 11/17/2015 0939   CALCIUM 10.0 10/15/2015 0851   GFRNONAA >60 11/17/2015 0939   GFRAA >60 11/17/2015 0939    Lipid Panel     Component Value Date/Time   CHOL 201 (H) 10/15/2015 0851   TRIG 122.0 10/15/2015 0851   HDL 47.60 10/15/2015 0851   CHOLHDL 4 10/15/2015 0851   VLDL 24.4 10/15/2015 0851   LDLCALC 129 (  H) 10/15/2015 0851    CBC    Component Value Date/Time   WBC 4.3 11/17/2015 0939   RBC 4.18 11/17/2015 0939   HGB 13.5 11/17/2015 0939   HCT 39.7 11/17/2015 0939   PLT 168 11/17/2015 0939   MCV 94.8 11/17/2015 0939   MCH 32.3 11/17/2015 0939   MCHC 34.0 11/17/2015 0939   RDW 12.9 11/17/2015 0939   LYMPHSABS 1.6 11/17/2015 0939   MONOABS 0.4 11/17/2015 0939   EOSABS 0.2 11/17/2015 0939   BASOSABS 0.1 11/17/2015 0939    Hgb A1C No results found for: HGBA1C   Assessment and Plan:

## 2017-10-17 NOTE — Assessment & Plan Note (Signed)
Stable off meds °Will monitor °

## 2017-10-17 NOTE — Patient Instructions (Signed)
Fat and Cholesterol Restricted Diet Getting too much fat and cholesterol in your diet may cause health problems. Following this diet helps keep your fat and cholesterol at normal levels. This can keep you from getting sick. What types of fat should I choose?  Choose monosaturated and polyunsaturated fats. These are found in foods such as olive oil, canola oil, flaxseeds, walnuts, almonds, and seeds.  Eat more omega-3 fats. Good choices include salmon, mackerel, sardines, tuna, flaxseed oil, and ground flaxseeds.  Limit saturated fats. These are in animal products such as meats, butter, and cream. They can also be in plant products such as palm oil, palm kernel oil, and coconut oil.  Avoid foods with partially hydrogenated oils in them. These contain trans fats. Examples of foods that have trans fats are stick margarine, some tub margarines, cookies, crackers, and other baked goods. What general guidelines do I need to follow?  Check food labels. Look for the words "trans fat" and "saturated fat."  When preparing a meal: ? Fill half of your plate with vegetables and green salads. ? Fill one fourth of your plate with whole grains. Look for the word "whole" as the first word in the ingredient list. ? Fill one fourth of your plate with lean protein foods.  Eat more foods that have fiber, like apples, carrots, beans, peas, and barley.  Eat more home-cooked foods. Eat less at restaurants and buffets.  Limit or avoid alcohol.  Limit foods high in starch and sugar.  Limit fried foods.  Cook foods without frying them. Baking, boiling, grilling, and broiling are all great options.  Lose weight if you are overweight. Losing even a small amount of weight can help your overall health. It can also help prevent diseases such as diabetes and heart disease. What foods can I eat? Grains Whole grains, such as whole wheat or whole grain breads, crackers, cereals, and pasta. Unsweetened oatmeal,  bulgur, barley, quinoa, or brown rice. Corn or whole wheat flour tortillas. Vegetables Fresh or frozen vegetables (raw, steamed, roasted, or grilled). Green salads. Fruits All fresh, canned (in natural juice), or frozen fruits. Meat and Other Protein Products Ground beef (85% or leaner), grass-fed beef, or beef trimmed of fat. Skinless chicken or turkey. Ground chicken or turkey. Pork trimmed of fat. All fish and seafood. Eggs. Dried beans, peas, or lentils. Unsalted nuts or seeds. Unsalted canned or dry beans. Dairy Low-fat dairy products, such as skim or 1% milk, 2% or reduced-fat cheeses, low-fat ricotta or cottage cheese, or plain low-fat yogurt. Fats and Oils Tub margarines without trans fats. Light or reduced-fat mayonnaise and salad dressings. Avocado. Olive, canola, sesame, or safflower oils. Natural peanut or almond butter (choose ones without added sugar and oil). The items listed above may not be a complete list of recommended foods or beverages. Contact your dietitian for more options. What foods are not recommended? Grains White bread. White pasta. White rice. Cornbread. Bagels, pastries, and croissants. Crackers that contain trans fat. Vegetables White potatoes. Corn. Creamed or fried vegetables. Vegetables in a cheese sauce. Fruits Dried fruits. Canned fruit in light or heavy syrup. Fruit juice. Meat and Other Protein Products Fatty cuts of meat. Ribs, chicken wings, bacon, sausage, bologna, salami, chitterlings, fatback, hot dogs, bratwurst, and packaged luncheon meats. Liver and organ meats. Dairy Whole or 2% milk, cream, half-and-half, and cream cheese. Whole milk cheeses. Whole-fat or sweetened yogurt. Full-fat cheeses. Nondairy creamers and whipped toppings. Processed cheese, cheese spreads, or cheese curds. Sweets and Desserts Corn   syrup, sugars, honey, and molasses. Candy. Jam and jelly. Syrup. Sweetened cereals. Cookies, pies, cakes, donuts, muffins, and ice  cream. Fats and Oils Butter, stick margarine, lard, shortening, ghee, or bacon fat. Coconut, palm kernel, or palm oils. Beverages Alcohol. Sweetened drinks (such as sodas, lemonade, and fruit drinks or punches). The items listed above may not be a complete list of foods and beverages to avoid. Contact your dietitian for more information. This information is not intended to replace advice given to you by your health care provider. Make sure you discuss any questions you have with your health care provider. Document Released: 09/20/2011 Document Revised: 11/26/2015 Document Reviewed: 06/20/2013 Elsevier Interactive Patient Education  2018 Elsevier Inc.  

## 2017-10-17 NOTE — Assessment & Plan Note (Signed)
CBC, CMET and Lipid profile today Encouraged her to consume a low fat diet Continue Fish Oil and Niacin for now

## 2017-10-17 NOTE — Assessment & Plan Note (Signed)
None in the last few years Will monitor

## 2018-01-03 ENCOUNTER — Other Ambulatory Visit: Payer: Self-pay | Admitting: Surgery

## 2018-01-03 DIAGNOSIS — Z1231 Encounter for screening mammogram for malignant neoplasm of breast: Secondary | ICD-10-CM

## 2018-04-02 ENCOUNTER — Ambulatory Visit
Admission: RE | Admit: 2018-04-02 | Discharge: 2018-04-02 | Disposition: A | Discharge status: Home / Self Care | Payer: Medicare Other | Source: Ambulatory Visit | Attending: Surgery | Admitting: Surgery

## 2018-04-02 DIAGNOSIS — Z1231 Encounter for screening mammogram for malignant neoplasm of breast: Secondary | ICD-10-CM | POA: Diagnosis not present

## 2018-09-28 DIAGNOSIS — H40013 Open angle with borderline findings, low risk, bilateral: Secondary | ICD-10-CM | POA: Diagnosis not present

## 2018-09-28 DIAGNOSIS — H524 Presbyopia: Secondary | ICD-10-CM | POA: Diagnosis not present

## 2018-10-24 DIAGNOSIS — Z853 Personal history of malignant neoplasm of breast: Secondary | ICD-10-CM | POA: Diagnosis not present

## 2018-12-12 DIAGNOSIS — H40053 Ocular hypertension, bilateral: Secondary | ICD-10-CM | POA: Diagnosis not present

## 2019-01-24 ENCOUNTER — Other Ambulatory Visit: Payer: Self-pay | Admitting: Surgery

## 2019-01-24 DIAGNOSIS — Z1231 Encounter for screening mammogram for malignant neoplasm of breast: Secondary | ICD-10-CM

## 2019-04-04 ENCOUNTER — Other Ambulatory Visit: Payer: Self-pay

## 2019-04-04 ENCOUNTER — Ambulatory Visit
Admission: RE | Admit: 2019-04-04 | Discharge: 2019-04-04 | Disposition: A | Discharge status: Home / Self Care | Payer: Medicare Other | Source: Ambulatory Visit | Attending: Surgery | Admitting: Surgery

## 2019-04-04 DIAGNOSIS — Z1231 Encounter for screening mammogram for malignant neoplasm of breast: Secondary | ICD-10-CM

## 2019-04-04 HISTORY — DX: Personal history of irradiation: Z92.3

## 2019-11-20 ENCOUNTER — Other Ambulatory Visit: Payer: Self-pay | Admitting: Surgery

## 2019-11-20 DIAGNOSIS — Z1231 Encounter for screening mammogram for malignant neoplasm of breast: Secondary | ICD-10-CM

## 2020-02-03 ENCOUNTER — Ambulatory Visit: Payer: Medicare Other | Admitting: Internal Medicine

## 2020-02-04 ENCOUNTER — Encounter: Payer: Self-pay | Admitting: Internal Medicine

## 2020-02-04 ENCOUNTER — Ambulatory Visit (INDEPENDENT_AMBULATORY_CARE_PROVIDER_SITE_OTHER): Payer: Medicare Other | Admitting: Internal Medicine

## 2020-02-04 ENCOUNTER — Other Ambulatory Visit: Payer: Self-pay

## 2020-02-04 VITALS — BP 118/72 | HR 57 | Temp 97.2°F | Wt 134.0 lb

## 2020-02-04 DIAGNOSIS — B028 Zoster with other complications: Secondary | ICD-10-CM | POA: Diagnosis not present

## 2020-02-04 MED ORDER — VALACYCLOVIR HCL 1 G PO TABS: 1000.0000 mg | ORAL_TABLET | Freq: Three times a day (TID) | ORAL | 0 refills | Status: DC

## 2020-02-04 NOTE — Progress Notes (Signed)
Subjective:    Patient ID: Kari Willis, female    DOB: 05-29-1950, 69 y.o.   MRN: [id number]  HPI  Pt presents to the clinic today with c/o rash on her back. She noticed this 2 weeks ago. The rash itches, burns and causes intermittent pain at times. The rash has not spread. She denies changes in soaps, lotions or detergents. No one in her home has a similar rash. She has tried Bacitracin and Capsacin cream OTC with minimal relief of symptoms.  Review of Systems  Past Medical History:  Diagnosis Date  . Cancer [facility]) oct 2013   breast  . Eye pressure    "high eye pressure"  . Headache   . Kidney stones   . Personal history of radiation therapy     Current Outpatient Medications  Medication Sig Dispense Refill  . aspirin 81 MG tablet Take 81 mg by mouth 3 (three) times a week.    . B Complex Vitamins (B COMPLEX-B12 PO) Take 1 tablet by mouth daily.    . Calcium Carbonate-Vitamin D (CALCIUM 600+D HIGH POTENCY PO) Take 1 tablet by mouth 2 (two) times daily.    . IRON, FERROUS SULFATE, PO Take 65 mg by mouth 2 (two) times a week.    . Multiple Vitamins-Minerals (CENTRUM SILVER ADULT 50+ PO) Take 1 tablet by mouth daily.    . niacin 500 MG tablet Take 500 mg by mouth at bedtime.    . Omega-3 Fatty Acids (FISH OIL) 1200 MG CAPS Take 1 capsule by mouth.    . timolol (BETIMOL) 0.5 % ophthalmic solution Place 1 drop into both eyes 2 (two) times daily.    . vitamin C (ASCORBIC ACID) 500 MG tablet Take 500 mg by mouth 2 (two) times daily.    . vitamin E 400 UNIT capsule Take 400 Units by mouth daily.     No current facility-administered medications for this visit.    Allergies  Allergen Reactions  . Demerol [Meperidine]     "burning sensation throughout body"    Family History  Problem Relation Age of Onset  . Cancer Mother   . Heart disease Father   . Heart disease Sister   . Heart disease Brother   . Stroke Paternal Aunt     Social History   Socioeconomic History  .  Marital status: Married    Spouse name: Not on file  . Number of children: Not on file  . Years of education: Not on file  . Highest education level: Not on file  Occupational History  . Not on file  Tobacco Use  . Smoking status: Never Smoker  . Smokeless tobacco: Never Used  Substance and Sexual Activity  . Alcohol use: Yes    Comment: one glass of wine every 6 months  . Drug use: No  . Sexual activity: Never  Other Topics Concern  . Not on file  Social History Narrative   Married.   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 Difficulty of Paying Living Expenses: Not on file  Food Insecurity:   . Worried About Charity fundraiser in the Last Year: Not on file  . Ran Out of Food in the Last Year: Not on file  Transportation Needs:   . Lack of Transportation (Medical):  Not on file  . Lack of Transportation (Non-Medical): Not on file  Physical Activity:   . Days of Exercise per Week: Not on file  . Minutes of Exercise per Session: Not on file  Stress:   . Feeling of Stress : Not on file  Social Connections:   . Frequency of Communication with Friends and Family: Not on file  . Frequency of Social Gatherings with Friends and Family: Not on file  . Attends Religious Services: Not on file  . Active Member of Clubs or Organizations: Not on file  . Attends Archivist Meetings: Not on file  . Marital Status: Not on file  Intimate Partner Violence:   . Fear of Current or Ex-Partner: Not on file  . Emotionally Abused: Not on file  . Physically Abused: Not on file  . Sexually Abused: Not on file     Constitutional: Denies fever, malaise, fatigue, headache or abrupt weight changes.  Respiratory: Denies difficulty breathing, shortness of breath, cough or sputum production.   Cardiovascular: Denies chest pain, chest tightness,  palpitations or swelling in the hands or feet.  Skin: Pt reports rash. Denies redness or ulcercations.    No other specific complaints in a complete review of systems (except as listed in HPI above).     Objective:   Physical Exam   BP 118/72   Pulse (!) 57   Temp (!) 97.2 F (36.2 C) (Temporal)   Wt 134 lb (60.8 kg)   SpO2 98%   BMI 25.74 kg/m   Wt Readings from Last 3 Encounters:  10/17/17 131 lb (59.4 kg)  09/13/17 133 lb (60.3 kg)  11/16/16 133 lb 9.6 oz (60.6 kg)    General: Appears her stated age, well developed, well nourished in NAD. Skin: Cluster of vesicular lesions on erythematous base noted of midline back. Cardiovascular: Bradycardic.  Pulmonary/Chest: Normal effort and positive vesicular breath sounds. No respiratory distress. No wheezes, rales or ronchi noted.  Neurological: Alert and oriented.    BMET    Component Value Date/Time   NA 139 10/17/2017 0905   K 5.0 10/17/2017 0905   CL 104 10/17/2017 0905   CO2 30 10/17/2017 0905   GLUCOSE 110 (H) 10/17/2017 0905   BUN 17 10/17/2017 0905   CREATININE 0.90 10/17/2017 0905   CALCIUM 9.7 10/17/2017 0905   GFRNONAA >60 11/17/2015 0939   GFRAA >60 11/17/2015 0939    Lipid Panel     Component Value Date/Time   CHOL 232 (H) 10/17/2017 0905   TRIG 110.0 10/17/2017 0905   HDL 57.20 10/17/2017 0905   CHOLHDL 4 10/17/2017 0905   VLDL 22.0 10/17/2017 0905   LDLCALC 153 (H) 10/17/2017 0905    CBC    Component Value Date/Time   WBC 4.9 10/17/2017 0905   RBC 4.33 10/17/2017 0905   HGB 14.0 10/17/2017 0905   HCT 41.6 10/17/2017 0905   PLT 185.0 10/17/2017 0905   MCV 96.1 10/17/2017 0905   MCH 32.3 11/17/2015 0939   MCHC 33.7 10/17/2017 0905   RDW 12.8 10/17/2017 0905   LYMPHSABS 1.6 11/17/2015 0939   MONOABS 0.4 11/17/2015 0939   EOSABS 0.2 11/17/2015 0939   BASOSABS 0.1 11/17/2015 0939    Hgb A1C No results found for: HGBA1C         Assessment & Plan:   Recurrent Shingles:  She is  not interested in suppressive therapy She is outside the window for treatment but will treat with Valtrex 1 gm TID  x 7 days, because this has persisted so long. She declines RX for Gabapentin for pain Discussed Shingrix vaccine, she will consider but gets reactions from each immunization she takes  Return precautions discussed  Webb Silversmith, NP This visit occurred during the SARS-CoV-2 public health emergency.  Safety protocols were in place, including screening questions prior to the visit, additional usage of staff PPE, and extensive cleaning of exam room while observing appropriate contact time as indicated for disinfecting solutions.

## 2020-02-04 NOTE — Patient Instructions (Signed)
Shingles  Shingles is an infection. It gives you a painful skin rash and blisters that have fluid in them. Shingles is caused by the same germ (virus) that causes chickenpox. Shingles only happens in people who:  Have had chickenpox.  Have been given a shot of medicine (vaccine) to protect against chickenpox. Shingles is rare in this group. The first symptoms of shingles may be itching, tingling, or pain in an area on your skin. A rash will show on your skin a few days or weeks later. The rash is likely to be on one side of your body. The rash usually has a shape like a belt or a band. Over time, the rash turns into fluid-filled blisters. The blisters will break open, change into scabs, and dry up. Medicines may:  Help with pain and itching.  Help you get better sooner.  Help to prevent long-term problems. Follow these instructions at home: Medicines  Take over-the-counter and prescription medicines only as told by your doctor.  Put on an anti-itch cream or numbing cream where you have a rash, blisters, or scabs. Do this as told by your doctor. Helping with itching and discomfort   Put cold, wet cloths (cold compresses) on the area of the rash or blisters as told by your doctor.  Cool baths can help you feel better. Try adding baking soda or dry oatmeal to the water to lessen itching. Do not bathe in hot water. Blister and rash care  Keep your rash covered with a loose bandage (dressing).  Wear loose clothing that does not rub on your rash.  Keep your rash and blisters clean. To do this, wash the area with mild soap and cool water as told by your doctor.  Check your rash every day for signs of infection. Check for: ? More redness, swelling, or pain. ? Fluid or blood. ? Warmth. ? Pus or a bad smell.  Do not scratch your rash. Do not pick at your blisters. To help you to not scratch: ? Keep your fingernails clean and cut short. ? Wear gloves or mittens when you sleep, if  scratching is a problem. General instructions  Rest as told by your doctor.  Keep all follow-up visits as told by your doctor. This is important.  Wash your hands often with soap and water. If soap and water are not available, use hand sanitizer. Doing this lowers your chance of getting a skin infection caused by germs (bacteria).  Your infection can cause chickenpox in people who have never had chickenpox or never got a shot of chickenpox vaccine. If you have blisters that did not change into scabs yet, try not to touch other people or be around other people, especially: ? Babies. ? Pregnant women. ? Children who have areas of red, itchy, or rough skin (eczema). ? Very old people who have transplants. ? People who have a long-term (chronic) sickness, like cancer or AIDS. Contact a doctor if:  Your pain does not get better with medicine.  Your pain does not get better after the rash heals.  You have any signs of infection in the rash area. These signs include: ? More redness, swelling, or pain around the rash. ? Fluid or blood coming from the rash. ? The rash area feeling warm to the touch. ? Pus or a bad smell coming from the rash. Get help right away if:  The rash is on your face or nose.  You have pain in your face or pain by   your eye.  You lose feeling on one side of your face.  You have trouble seeing.  You have ear pain, or you have ringing in your ear.  You have a loss of taste.  Your condition gets worse. Summary  Shingles gives you a painful skin rash and blisters that have fluid in them.  Shingles is an infection. It is caused by the same germ (virus) that causes chickenpox.  Keep your rash covered with a loose bandage (dressing). Wear loose clothing that does not rub on your rash.  If you have blisters that did not change into scabs yet, try not to touch other people or be around people. This information is not intended to replace advice given to you by  your health care provider. Make sure you discuss any questions you have with your health care provider. Document Revised: 07/13/2018 Document Reviewed: 11/23/2016 Elsevier Patient Education  2020 Elsevier Inc.  

## 2020-02-12 DIAGNOSIS — H40013 Open angle with borderline findings, low risk, bilateral: Secondary | ICD-10-CM | POA: Diagnosis not present

## 2020-02-12 DIAGNOSIS — H40053 Ocular hypertension, bilateral: Secondary | ICD-10-CM | POA: Diagnosis not present

## 2020-03-11 DIAGNOSIS — H40053 Ocular hypertension, bilateral: Secondary | ICD-10-CM | POA: Diagnosis not present

## 2020-03-17 DIAGNOSIS — Z23 Encounter for immunization: Secondary | ICD-10-CM | POA: Diagnosis not present

## 2020-04-06 ENCOUNTER — Ambulatory Visit: Payer: Medicare Other

## 2020-04-07 ENCOUNTER — Ambulatory Visit: Payer: Medicare Other

## 2020-05-19 ENCOUNTER — Ambulatory Visit
Admission: RE | Admit: 2020-05-19 | Discharge: 2020-05-19 | Disposition: A | Discharge status: Home / Self Care | Payer: Medicare Other | Source: Ambulatory Visit | Attending: Surgery | Admitting: Surgery

## 2020-05-19 ENCOUNTER — Other Ambulatory Visit: Payer: Self-pay

## 2020-05-19 DIAGNOSIS — Z1231 Encounter for screening mammogram for malignant neoplasm of breast: Secondary | ICD-10-CM | POA: Diagnosis not present

## 2020-05-20 ENCOUNTER — Ambulatory Visit: Payer: Medicare Other

## 2020-05-25 ENCOUNTER — Other Ambulatory Visit: Payer: Self-pay | Admitting: Surgery

## 2020-05-25 DIAGNOSIS — R928 Other abnormal and inconclusive findings on diagnostic imaging of breast: Secondary | ICD-10-CM

## 2020-06-03 ENCOUNTER — Other Ambulatory Visit: Payer: Self-pay | Admitting: Surgery

## 2020-06-03 ENCOUNTER — Ambulatory Visit
Admission: RE | Admit: 2020-06-03 | Discharge: 2020-06-03 | Disposition: A | Discharge status: Home / Self Care | Payer: Medicare Other | Source: Ambulatory Visit | Attending: Surgery | Admitting: Surgery

## 2020-06-03 ENCOUNTER — Other Ambulatory Visit: Payer: Self-pay

## 2020-06-03 DIAGNOSIS — R928 Other abnormal and inconclusive findings on diagnostic imaging of breast: Secondary | ICD-10-CM

## 2020-06-03 DIAGNOSIS — R921 Mammographic calcification found on diagnostic imaging of breast: Secondary | ICD-10-CM | POA: Diagnosis not present

## 2020-07-15 DIAGNOSIS — H2513 Age-related nuclear cataract, bilateral: Secondary | ICD-10-CM | POA: Diagnosis not present

## 2020-07-15 DIAGNOSIS — H40053 Ocular hypertension, bilateral: Secondary | ICD-10-CM | POA: Diagnosis not present

## 2020-08-04 DIAGNOSIS — R921 Mammographic calcification found on diagnostic imaging of breast: Secondary | ICD-10-CM | POA: Diagnosis not present

## 2020-08-04 DIAGNOSIS — L989 Disorder of the skin and subcutaneous tissue, unspecified: Secondary | ICD-10-CM | POA: Diagnosis not present

## 2020-08-04 DIAGNOSIS — Z853 Personal history of malignant neoplasm of breast: Secondary | ICD-10-CM | POA: Diagnosis not present

## 2020-08-12 DIAGNOSIS — D2271 Melanocytic nevi of right lower limb, including hip: Secondary | ICD-10-CM | POA: Diagnosis not present

## 2020-08-12 DIAGNOSIS — D2262 Melanocytic nevi of left upper limb, including shoulder: Secondary | ICD-10-CM | POA: Diagnosis not present

## 2020-08-12 DIAGNOSIS — D2261 Melanocytic nevi of right upper limb, including shoulder: Secondary | ICD-10-CM | POA: Diagnosis not present

## 2020-08-12 DIAGNOSIS — C44519 Basal cell carcinoma of skin of other part of trunk: Secondary | ICD-10-CM | POA: Diagnosis not present

## 2020-08-12 DIAGNOSIS — D225 Melanocytic nevi of trunk: Secondary | ICD-10-CM | POA: Diagnosis not present

## 2020-08-12 DIAGNOSIS — L821 Other seborrheic keratosis: Secondary | ICD-10-CM | POA: Diagnosis not present

## 2020-08-12 DIAGNOSIS — D485 Neoplasm of uncertain behavior of skin: Secondary | ICD-10-CM | POA: Diagnosis not present

## 2020-08-12 DIAGNOSIS — D2272 Melanocytic nevi of left lower limb, including hip: Secondary | ICD-10-CM | POA: Diagnosis not present

## 2020-09-02 DIAGNOSIS — C44519 Basal cell carcinoma of skin of other part of trunk: Secondary | ICD-10-CM | POA: Diagnosis not present

## 2020-09-30 ENCOUNTER — Encounter: Payer: Self-pay | Admitting: Internal Medicine

## 2020-09-30 ENCOUNTER — Ambulatory Visit (INDEPENDENT_AMBULATORY_CARE_PROVIDER_SITE_OTHER): Payer: Medicare Other | Admitting: Internal Medicine

## 2020-09-30 ENCOUNTER — Other Ambulatory Visit: Payer: Self-pay

## 2020-09-30 VITALS — BP 116/64 | HR 64 | Temp 98.1°F | Ht 60.51 in | Wt 132.2 lb

## 2020-09-30 DIAGNOSIS — E7800 Pure hypercholesterolemia, unspecified: Secondary | ICD-10-CM

## 2020-09-30 DIAGNOSIS — Z8639 Personal history of other endocrine, nutritional and metabolic disease: Secondary | ICD-10-CM

## 2020-09-30 DIAGNOSIS — Z853 Personal history of malignant neoplasm of breast: Secondary | ICD-10-CM

## 2020-09-30 DIAGNOSIS — E785 Hyperlipidemia, unspecified: Secondary | ICD-10-CM

## 2020-09-30 DIAGNOSIS — R7301 Impaired fasting glucose: Secondary | ICD-10-CM | POA: Diagnosis not present

## 2020-09-30 DIAGNOSIS — E559 Vitamin D deficiency, unspecified: Secondary | ICD-10-CM | POA: Diagnosis not present

## 2020-09-30 DIAGNOSIS — N200 Calculus of kidney: Secondary | ICD-10-CM | POA: Diagnosis not present

## 2020-09-30 DIAGNOSIS — Z8619 Personal history of other infectious and parasitic diseases: Secondary | ICD-10-CM

## 2020-09-30 NOTE — Patient Instructions (Signed)
Nice to meet you and welcome!   Return for fasting labs at your leisure

## 2020-09-30 NOTE — Progress Notes (Signed)
Subjective:  Patient ID: Kari Willis, female    DOB: Sep 06, 1950  Age: 70 y.o. MRN: [id number]  CC: The primary encounter diagnosis was History of iron deficiency. Diagnoses of Vitamin D deficiency, Hyperlipidemia LDL goal <160, Impaired fasting glucose, Pure hypercholesterolemia, Kidney stones, History of shingles, and History of breast cancer were also pertinent to this visit.  HPI Kari Willis presents for establishment of care.   This visit occurred during the SARS-CoV-2 public health emergency.  Safety protocols were in place, including screening questions prior to the visit, additional usage of staff PPE, and extensive cleaning of exam room while observing appropriate contact time as indicated for disinfecting solutions.   Kari Willis is a healthy 70 yr old female with a history of breast CA treated with lumpectomy of the left breast and XRT in 2013. SEES Kari Willis in [facility] for elevated eye pressure  Husband Kari Willis, Kari Willis and Kari Willis,  38 seeing Kari Willis. Also wants to transfer to this office . Her recovery from XRT was complicated by abn episode of shingles in the same dermatome.  She continues to have recurrent episodes of back pain and itching.    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  Kari Willis raising. Walks up to 12000 steps daily .  No joint pain  .  Exercises daily .  Retired from [place] to live near relatives here in [place] .  Enjoys [place]   Used to use  [employer] daily due to headaches attributed to sinus pressure.  Stopped recently for no clear reason   FH:  Father had AMI at 65 and died at home. takes iron two times weekly  history of childhood    Outpatient Medications Prior to Visit  Medication Sig Dispense Refill   aspirin 81 MG tablet Take 81 mg by mouth 3 (three) times a week.     B Complex Vitamins (B COMPLEX-B12 PO) Take 1 tablet by mouth daily.      Calcium Carbonate-Vitamin D (CALCIUM 600+D HIGH POTENCY PO) Take 1 tablet by mouth 2 (two) times daily.     IRON, FERROUS SULFATE, PO Take 65 mg by mouth 2 (two) times a week.     Multiple Vitamins-Minerals (CENTRUM SILVER ADULT 50+ PO) Take 1 tablet by mouth daily.     niacin 500 MG tablet Take 500 mg by mouth at bedtime.     Omega-3 Fatty Acids (FISH OIL) 1200 MG CAPS Take 1 capsule by mouth.     timolol (BETIMOL) 0.5 % ophthalmic solution Place 1 drop into both eyes 2 (two) times daily.     vitamin C (ASCORBIC ACID) 500 MG tablet Take 500 mg by mouth 2 (two) times daily.     vitamin E 400 UNIT capsule Take 400 Units by mouth daily.     valACYclovir (VALTREX) 1000 MG tablet Take 1 tablet (1,000 mg total) by mouth 3 (three) times daily. 21 tablet 0   No facility-administered medications prior to visit.    Review of Systems;  Patient denies headache, fevers, malaise, unintentional weight loss, skin rash, eye pain, sinus congestion and sinus pain, sore throat, dysphagia,  hemoptysis , cough, dyspnea, wheezing, chest pain, palpitations, orthopnea, edema, abdominal pain, nausea, melena, diarrhea, constipation, flank pain, dysuria, hematuria, urinary  Frequency, nocturia, numbness, tingling, seizures,  Focal weakness, Loss of consciousness,  Tremor, insomnia, depression, anxiety, and  suicidal ideation.      Objective:  BP 116/64   Pulse 64   Temp 98.1 F (36.7 C)   Ht 5' 0.51" (1.537 m)   Wt 132 lb 3.2 oz (60 kg)   SpO2 97%   BMI 25.38 kg/m   BP Readings from Last 3 Encounters:  09/30/20 116/64  02/04/20 118/72  10/17/17 116/74    Wt Readings from Last 3 Encounters:  09/30/20 132 lb 3.2 oz (60 kg)  02/04/20 134 lb (60.8 kg)  10/17/17 131 lb (59.4 kg)    General appearance: alert, cooperative and appears stated age Ears: normal TM's and external ear canals both ears Throat: lips, mucosa, and tongue normal; teeth and gums normal Neck: no adenopathy, no carotid bruit, supple,  symmetrical, trachea midline and thyroid not enlarged, symmetric, no tenderness/mass/nodules Back: symmetric, no curvature. ROM normal. No CVA tenderness. Lungs: clear to auscultation bilaterally Heart: regular rate and rhythm, S1, S2 normal, no murmur, click, rub or gallop Abdomen: soft, non-tender; bowel sounds normal; no masses,  no organomegaly Pulses: 2+ and symmetric Skin: Skin color, texture, turgor normal. No rashes or lesions Lymph nodes: Cervical, supraclavicular, and axillary nodes normal.  No results found for: HGBA1C  Lab Results  Component Value Date   CREATININE 0.90 10/17/2017   CREATININE 0.89 11/17/2015   CREATININE 0.93 10/15/2015    Lab Results  Component Value Date   WBC 4.9 10/17/2017   HGB 14.0 10/17/2017   HCT 41.6 10/17/2017   PLT 185.0 10/17/2017   GLUCOSE 110 (H) 10/17/2017   CHOL 232 (H) 10/17/2017   TRIG 110.0 10/17/2017   HDL 57.20 10/17/2017   LDLCALC 153 (H) 10/17/2017   ALT 18 10/17/2017   AST 17 10/17/2017   NA 139 10/17/2017   K 5.0 10/17/2017   CL 104 10/17/2017   CREATININE 0.90 10/17/2017   BUN 17 10/17/2017   CO2 30 10/17/2017   TSH 2.16 10/15/2015    MM Digital Diagnostic Unilat L  Result Date: 06/03/2020 CLINICAL DATA:  . History screening recall for left of a left breast calcifications lumpectomy for breast carcinoma performed in 2013. EXAM: DIGITAL DIAGNOSTIC UNILATERAL LEFT MAMMOGRAM WITH CAD TECHNIQUE: Left digital diagnostic mammography was performed. Mammographic images were processed with CAD. COMPARISON:  Previous exam(s). ACR Breast Density Category c: The breast tissue is heterogeneously dense, which may obscure small masses. FINDINGS: There are a few small calcifications that lie within the lumpectomy bed, 1 curvilinear in the other 2 of 3 punctate. There is no associated mass. There is no new density and there is no evidence of nonsurgical architectural distortion. IMPRESSION: Probably benign left breast calcifications  within the left lumpectomy bed, most likely due to fat necrosis. Short-term follow-up recommended. RECOMMENDATION: Diagnostic left breast mammography with magnification views in 6 months. I have discussed the findings and recommendations with the patient. If applicable, a reminder letter will be sent to the patient regarding the next appointment. BI-RADS CATEGORY  3: Probably benign. Electronically Signed   By: Lajean Manes M.D.   On: 06/03/2020 13:49    Assessment & Plan:   Problem List Items Addressed This Visit       Unprioritized   History of breast cancer    S/p lumpectomy and XRT in 2013.  Mammogram is Up to date.        History of shingles    Most recent episode Nov 2021 treated with acyclovir.        HLD (hyperlipidemia)    Discussed  taking a more aggressive approach to management of hyperlipidemia given her family history        Kidney stones    No recent episodes  .  She maintains more than adequate hydration        Other Visit Diagnoses     History of iron deficiency    -  Primary   Relevant Orders   CBC with Differential/Platelet   Iron, TIBC and Ferritin Panel   Vitamin D deficiency       Relevant Orders   VITAMIN D 25 Hydroxy (Vit-D Deficiency, Fractures)   Hyperlipidemia LDL goal <160       Relevant Orders   Lipid panel   TSH   Impaired fasting glucose       Relevant Orders   Comprehensive metabolic panel   Hemoglobin A1c     A total of 30 minutes was spent with patient more than half of which was spent in counseling patient on recurrent shingles ,  hyperlipidemia,  the above mentioned issues , reviewing and explaining recent labs, and coordination of care.   I am having Greer Ee maintain her timolol, Multiple Vitamins-Minerals (CENTRUM SILVER ADULT 50+ PO), Calcium Carbonate-Vitamin D (CALCIUM 600+D HIGH POTENCY PO), vitamin C, vitamin E, B Complex Vitamins (B COMPLEX-B12 PO), Fish Oil, aspirin, niacin, (IRON, FERROUS SULFATE, PO), and  valACYclovir.  No orders of the defined types were placed in this encounter.   There are no discontinued medications.  Follow-up: No follow-ups on file.   Crecencio Mc, MD

## 2020-10-01 NOTE — Assessment & Plan Note (Signed)
No recent episodes  .  She maintains more than adequate hydration

## 2020-10-01 NOTE — Assessment & Plan Note (Signed)
Most recent episode Nov 2021 treated with acyclovir.

## 2020-10-01 NOTE — Assessment & Plan Note (Signed)
Discussed taking a more aggressive approach to management of hyperlipidemia given her family history

## 2020-10-01 NOTE — Assessment & Plan Note (Signed)
S/p lumpectomy and XRT in 2013.  Mammogram is Up to date.

## 2020-10-07 ENCOUNTER — Other Ambulatory Visit (INDEPENDENT_AMBULATORY_CARE_PROVIDER_SITE_OTHER): Payer: Medicare Other

## 2020-10-07 ENCOUNTER — Other Ambulatory Visit: Payer: Self-pay

## 2020-10-07 DIAGNOSIS — R7301 Impaired fasting glucose: Secondary | ICD-10-CM

## 2020-10-07 DIAGNOSIS — E559 Vitamin D deficiency, unspecified: Secondary | ICD-10-CM

## 2020-10-07 DIAGNOSIS — Z8639 Personal history of other endocrine, nutritional and metabolic disease: Secondary | ICD-10-CM

## 2020-10-07 DIAGNOSIS — E785 Hyperlipidemia, unspecified: Secondary | ICD-10-CM

## 2020-10-07 LAB — COMPREHENSIVE METABOLIC PANEL
ALT: 18 U/L (ref 0–35)
AST: 18 U/L (ref 0–37)
Albumin: 4.3 g/dL (ref 3.5–5.2)
Alkaline Phosphatase: 46 U/L (ref 39–117)
BUN: 18 mg/dL (ref 6–23)
CO2: 29 mEq/L (ref 19–32)
Calcium: 9.7 mg/dL (ref 8.4–10.5)
Chloride: 102 mEq/L (ref 96–112)
Creatinine, Ser: 0.98 mg/dL (ref 0.40–1.20)
GFR: 58.16 mL/min — ABNORMAL LOW (ref >60.00)
Glucose, Bld: 106 mg/dL — ABNORMAL HIGH (ref 70–99)
Potassium: 4.2 mEq/L (ref 3.5–5.1)
Sodium: 137 mEq/L (ref 135–145)
Total Bilirubin: 0.6 mg/dL (ref 0.2–1.2)
Total Protein: 6.9 g/dL (ref 6.0–8.3)

## 2020-10-07 LAB — CBC WITH DIFFERENTIAL/PLATELET
Basophils Absolute: 0.1 10*3/uL (ref 0.0–0.1)
Basophils Relative: 2 % (ref 0.0–3.0)
Eosinophils Absolute: 0.2 10*3/uL (ref 0.0–0.7)
Eosinophils Relative: 4.1 % (ref 0.0–5.0)
HCT: 39 % (ref 36.0–46.0)
Hemoglobin: 13.3 g/dL (ref 12.0–15.0)
Lymphocytes Relative: 39.1 % (ref 12.0–46.0)
Lymphs Abs: 1.6 10*3/uL (ref 0.7–4.0)
MCHC: 34.1 g/dL (ref 30.0–36.0)
MCV: 94.9 fl (ref 78.0–100.0)
Monocytes Absolute: 0.3 10*3/uL (ref 0.1–1.0)
Monocytes Relative: 7.7 % (ref 3.0–12.0)
Neutro Abs: 1.9 10*3/uL (ref 1.4–7.7)
Neutrophils Relative %: 47.1 % (ref 43.0–77.0)
Platelets: 176 10*3/uL (ref 150.0–400.0)
RBC: 4.11 Mil/uL (ref 3.87–5.11)
RDW: 13.2 % (ref 11.5–15.5)
WBC: 4.1 10*3/uL (ref 4.0–10.5)

## 2020-10-07 LAB — VITAMIN D 25 HYDROXY (VIT D DEFICIENCY, FRACTURES): VITD: 57.18 ng/mL (ref 30.00–100.00)

## 2020-10-07 LAB — HEMOGLOBIN A1C: Hgb A1c MFr Bld: 5.8 % (ref 4.6–6.5)

## 2020-10-07 LAB — LIPID PANEL
Cholesterol: 219 mg/dL — ABNORMAL HIGH (ref 0–200)
HDL: 52.1 mg/dL (ref >39.00)
LDL Cholesterol: 150 mg/dL — ABNORMAL HIGH (ref 0–99)
NonHDL: 167.18
Total CHOL/HDL Ratio: 4
Triglycerides: 87 mg/dL (ref 0.0–149.0)
VLDL: 17.4 mg/dL (ref 0.0–40.0)

## 2020-10-07 LAB — IBC + FERRITIN
Ferritin: 202.7 ng/mL (ref 10.0–291.0)
Iron: 100 ug/dL (ref 42–145)
Saturation Ratios: 30.9 % (ref 20.0–50.0)
Transferrin: 231 mg/dL (ref 212.0–360.0)

## 2020-10-07 LAB — TSH: TSH: 3.36 u[IU]/mL (ref 0.35–5.50)

## 2020-10-07 NOTE — Addendum Note (Signed)
Addended by: Leeanne Rio on: 10/07/2020 08:34 AM   Modules accepted: Orders

## 2020-10-08 ENCOUNTER — Other Ambulatory Visit: Payer: Self-pay | Admitting: Internal Medicine

## 2020-10-08 DIAGNOSIS — N182 Chronic kidney disease, stage 2 (mild): Secondary | ICD-10-CM

## 2020-12-08 ENCOUNTER — Other Ambulatory Visit: Payer: Self-pay

## 2020-12-08 ENCOUNTER — Ambulatory Visit
Admission: RE | Admit: 2020-12-08 | Discharge: 2020-12-08 | Disposition: A | Discharge status: Home / Self Care | Payer: Medicare Other | Source: Ambulatory Visit | Attending: Surgery | Admitting: Surgery

## 2020-12-08 ENCOUNTER — Other Ambulatory Visit: Payer: Self-pay | Admitting: Surgery

## 2020-12-08 DIAGNOSIS — R921 Mammographic calcification found on diagnostic imaging of breast: Secondary | ICD-10-CM | POA: Diagnosis not present

## 2020-12-08 DIAGNOSIS — R928 Other abnormal and inconclusive findings on diagnostic imaging of breast: Secondary | ICD-10-CM

## 2020-12-08 DIAGNOSIS — R922 Inconclusive mammogram: Secondary | ICD-10-CM | POA: Diagnosis not present

## 2020-12-15 ENCOUNTER — Ambulatory Visit (INDEPENDENT_AMBULATORY_CARE_PROVIDER_SITE_OTHER): Payer: Medicare Other

## 2020-12-15 VITALS — Ht 60.51 in | Wt 132.0 lb

## 2020-12-15 DIAGNOSIS — Z0000 Encounter for general adult medical examination without abnormal findings: Secondary | ICD-10-CM

## 2020-12-15 NOTE — Patient Instructions (Addendum)
  Kari Willis , Thank you for taking time to come for your Medicare Wellness Visit. I appreciate your ongoing commitment to your health goals. Please review the following plan we discussed and let me know if I can assist you in the future.   These are the goals we discussed:  Goals      monitor carb intake        This is a list of the screening recommended for you and due dates:  Health Maintenance  Topic Date Due   COVID-19 Vaccine (4 - Booster for Moderna series) 12/31/2020*   Zoster (Shingles) Vaccine (1 of 2) 03/16/2021*   Flu Shot  07/02/2021*   Pneumonia vaccines (2 of 2 - PPSV23) 12/15/2021*   Mammogram  06/04/2022   Colon Cancer Screening  02/09/2023   Tetanus Vaccine  02/11/2025   DEXA scan (bone density measurement)  Completed   Hepatitis C Screening: USPSTF Recommendation to screen - Ages 60-79 yo.  Completed   HPV Vaccine  Aged Out  *Topic was postponed. The date shown is not the original due date.

## 2020-12-15 NOTE — Progress Notes (Addendum)
Subjective:   Kari Willis is a 70 y.o. female who presents for Medicare Annual (Subsequent) preventive examination.  Review of Systems    No ROS.  Medicare Wellness Virtual Visit.  Visual/audio telehealth visit, UTA vital signs.   See social history for additional risk factors.   Cardiac Risk Factors include: advanced age (>39mn, >>70women)     Objective:    Today's Vitals   12/15/20 1122  Weight: 132 lb (59.9 kg)  Height: 5' 0.51" (1.537 m)   Body mass index is 25.35 kg/m.  Advanced Directives 12/15/2020 11/16/2016 11/17/2015 10/15/2015 11/18/2014  Does Patient Have a Medical Advance Directive? Yes No No No No  Type of AParamed[place]of [place] will - - - -  Does patient want to make changes to medical advance directive? No - Patient declined - - - -  Copy of HHancockin Chart? No - copy requested - - - -  Would patient like information on creating a medical advance directive? - - No - patient declined information No - patient declined information Yes - Educational materials given    Current Medications (verified) Outpatient Encounter Medications as of 12/15/2020  Medication Sig   B Complex Vitamins (B COMPLEX-B12 PO) Take 1 tablet by mouth daily.   Calcium Carbonate-Vitamin D (CALCIUM 600+D HIGH POTENCY PO) Take 1 tablet by mouth 2 (two) times daily.   IRON, FERROUS SULFATE, PO Take 65 mg by mouth 2 (two) times a week.   Multiple Vitamins-Minerals (CENTRUM SILVER ADULT 50+ PO) Take 1 tablet by mouth daily.   niacin 500 MG tablet Take 500 mg by mouth at bedtime.   Omega-3 Fatty Acids (FISH OIL) 1200 MG CAPS Take 1 capsule by mouth.   timolol (BETIMOL) 0.5 % ophthalmic solution Place 1 drop into both eyes 2 (two) times daily.   vitamin C (ASCORBIC ACID) 500 MG tablet Take 500 mg by mouth 2 (two) times daily.   vitamin E 400 UNIT capsule Take 400 Units by mouth daily.   [DISCONTINUED] aspirin 81 MG tablet Take 81 mg by mouth 3  (three) times a week.   [DISCONTINUED] valACYclovir (VALTREX) 1000 MG tablet Take 1 tablet (1,000 mg total) by mouth 3 (three) times daily.   No facility-administered encounter medications on file as of 12/15/2020.    Allergies (verified) Demerol [meperidine]   History: Past Medical History:  Diagnosis Date   Cancer [place]   breast   Eye pressure    "high eye pressure"   Headache    Kidney stones    Personal history of radiation therapy    Past Surgical History:  Procedure Laterality Date   BREAST BIOPSY     BREAST LUMPECTOMY  dec 2013   HERNIA REPAIR     Family History  Problem Relation Age of Onset   Cancer Mother    Heart disease Father    Heart disease Sister    Heart disease Brother    Stroke Paternal Aunt    Social History   Socioeconomic History   Marital status: Married    Spouse name: Not on file   Number of children: Not on file   Years of education: Not on file   Highest education level: Not on file  Occupational History   Not on file  Tobacco Use   Smoking status: Never   Smokeless tobacco: Never  Substance and Sexual Activity   Alcohol use: Yes    Comment: one glass  of wine every 6 months   Drug use: No   Sexual activity: Never  Other Topics Concern   Not on file  Social History Narrative   Married.   Retired from [employer] from [employer] in Fall 2015      She is a DNR- yellow sheet given to pt on 10/14/14   Social Determinants of Radio broadcast assistant Strain: Low Risk    Difficulty of Paying Living Expenses: Not hard at all  Food Insecurity: No Food Insecurity   Worried About Charity fundraiser in the Last Year: Never true   Arboriculturist in the Last Year: Never true  Transportation Needs: No Transportation Needs   Lack of Transportation (Medical): No   Lack of Transportation (Non-Medical): No  Physical Activity: Not on file  Stress: No Stress Concern Present   Feeling of Stress : Not at all  Social Connections:  Unknown   Frequency of Communication with Friends and Family: Not on file   Frequency of Social Gatherings with Friends and Family: Not on file   Attends Religious Services: Not on file   Active Member of Clubs or Organizations: Not on file   Attends Archivist Meetings: Not on file   Marital Status: Married    Tobacco Counseling Counseling given: Not Answered   Clinical Intake:  Pre-visit preparation completed: Yes        Diabetes: No  How often do you need to have someone help you when you read instructions, pamphlets, or other written materials from your doctor or pharmacy?: 1 - Never   Interpreter Needed?: No    Activities of Daily Living In your present state of health, do you have any difficulty performing the following activities: 12/15/2020  Hearing? N  Vision? N  Difficulty concentrating or making decisions? N  Walking or climbing stairs? N  Dressing or bathing? N  Doing errands, shopping? N  Preparing Food and eating ? N  Using the Toilet? N  In the past six months, have you accidently leaked urine? N  Do you have problems with loss of bowel control? N  Managing your Medications? N  Managing your Finances? N  Housekeeping or managing your Housekeeping? N  Some recent data might be hidden    Patient Care Team: Crecencio Mc, MD as PCP - General (Internal Medicine) Lloyd Huger, MD as Consulting Physician (Oncology) Alphonsa Overall, MD as Consulting Physician (General Surgery) Lucious Groves, OD as Referring Physician (Optometry)  Indicate any recent Medical Services you may have received from other than [facility] providers in the past year (date may be approximate).     Assessment:   This is a routine wellness examination for Kari Willis.  I connected with Kari Willis today by telephone and verified that I am speaking with the correct person using two identifiers. Location patient: home Location provider: work Persons participating in the virtual  visit: patient, Marine scientist.    I discussed the limitations, risks, security and privacy concerns of performing an evaluation and management service by telephone and the availability of in person appointments. The patient expressed understanding and verbally consented to this telephonic visit.    Interactive audio and video telecommunications were attempted between this provider and patient, however failed, due to patient having technical difficulties OR patient did not have access to video capability.  We continued and completed visit with audio only.  Some vital signs may be absent or patient reported.   Hearing/Vision screen  Hearing Screening - Comments:: Patient is able to hear conversational tones without difficulty.  No issues reported. Vision Screening - Comments:: Kari Willis, Kari Willis 2x visits per year  Dietary issues and exercise activities discussed: Current Exercise Habits: Home exercise routine, Time (Minutes): 30, Frequency (Times/Week): 5, Weekly Exercise (Minutes/Week): 150Healthy diet Good water intake   Goals Addressed             This Visit's Progress    monitor carb intake         Depression Screen PHQ 2/9 Scores 12/15/2020 09/30/2020 10/17/2017 10/15/2015 10/14/2014  PHQ - 2 Score 0 0 0 0 0  PHQ- 9 Score - 3 - - -    Fall Risk Fall Risk  12/15/2020 09/30/2020 10/17/2017 10/15/2015 10/14/2014  Falls in the past year? 0 0 No No No  Number falls in past yr: 0 0 - - -  Injury with Fall? 0 0 - - -  Follow up Falls evaluation completed Falls evaluation completed - - -    FALL RISK PREVENTION PERTAINING TO THE HOME: Adequate lighting in your home to reduce risk of falls? Yes   ASSISTIVE DEVICES UTILIZED TO PREVENT FALLS: Use of a cane, walker or w/c? No   TIMED UP AND GO: Was the test performed? No .   Cognitive Function: Patient is alert and oriented x3.  Denies difficulty focusing, making decisions, memory loss.  Enjoys brain health engaging  activities.  MMSE/6CIt deferred. Normal by direct communication/observation.   MMSE - Mini Mental State Exam 10/15/2015  Orientation to time 5  Orientation to Place 5  Registration 3  Attention/ Calculation 0  Recall 3  Language- name 2 objects 0  Language- repeat 1  Language- follow 3 step command 3  Language- read & follow direction 0  Write a sentence 0  Copy design 0  Total score 20        Immunizations Immunization History  Administered Date(s) Administered   Influenza, High Dose Seasonal PF 01/10/2020   Influenza,inj,Quad PF,6+ Mos 02/12/2015   Influenza-Unspecified 01/02/2018   Moderna Sars-Covid-2 Vaccination 05/04/2019, 06/03/2019, 03/17/2020   Pneumococcal Conjugate-13 01/21/2015   Tdap 02/12/2015   Zoster, Live 04/11/2013   Health Maintenance There are no preventive care reminders to display for this patient. Health Maintenance  Topic Date Due   COVID-19 Vaccine (4 - Booster for Moderna series) 12/31/2020 (Originally 07/16/2020)   Zoster Vaccines- Shingrix (1 of 2) 03/16/2021 (Originally 06/20/1999)   INFLUENZA VACCINE  07/02/2021 (Originally 11/02/2020)   PNA vac Low Risk Adult (2 of 2 - PPSV23) 12/15/2021 (Originally 01/21/2016)   MAMMOGRAM  06/04/2022   COLONOSCOPY (Pts 45-72yr Insurance coverage will need to be confirmed)  02/09/2023   TETANUS/TDAP  02/11/2025   DEXA SCAN  Completed   Hepatitis C Screening  Completed   HPV VACCINES  Aged Out   Lung Cancer Screening: (Low Dose CT Chest recommended if Age 71-80years, 30 pack-year currently smoking OR have quit w/in 15years.) does not qualify.   Vision Screening: Recommended annual ophthalmology exams for early detection of glaucoma and other disorders of the eye. [place]   Dental Screening: Recommended annual dental exams for proper oral hygiene. Kari Willis Visits every 6 months.   Community Resource Referral / Chronic Care Management: CRR required this visit?  No   CCM required this  visit?  No    Dr. BBarry Dienesfollows mammogram.  Plan:   Keep all routine maintenance appointments.   I have personally  reviewed and noted the following in the patient's chart:   Medical and social history Use of alcohol, tobacco or illicit drugs  Current medications and supplements including opioid prescriptions. Not taking opioid.  Functional ability and status Nutritional status Physical activity Advanced directives List of other physicians Hospitalizations, surgeries, and ER visits in previous 12 months Vitals Screenings to include cognitive, depression, and falls Referrals and appointments  In addition, I have reviewed and discussed with patient certain preventive protocols, quality metrics, and best practice recommendations. A written personalized care plan for preventive services as well as general preventive health recommendations were provided to patient via mychart.     Kari Willis, Kari Buchholz Willis, Kari Willis   [id number]     I have reviewed the above information and agree with above.   Kari Medina, MD

## 2021-01-13 DIAGNOSIS — H40053 Ocular hypertension, bilateral: Secondary | ICD-10-CM | POA: Diagnosis not present

## 2021-01-19 DIAGNOSIS — L821 Other seborrheic keratosis: Secondary | ICD-10-CM | POA: Diagnosis not present

## 2021-01-19 DIAGNOSIS — X32XXXA Exposure to sunlight, initial encounter: Secondary | ICD-10-CM | POA: Diagnosis not present

## 2021-01-19 DIAGNOSIS — D2261 Melanocytic nevi of right upper limb, including shoulder: Secondary | ICD-10-CM | POA: Diagnosis not present

## 2021-01-19 DIAGNOSIS — D225 Melanocytic nevi of trunk: Secondary | ICD-10-CM | POA: Diagnosis not present

## 2021-01-19 DIAGNOSIS — D2272 Melanocytic nevi of left lower limb, including hip: Secondary | ICD-10-CM | POA: Diagnosis not present

## 2021-01-19 DIAGNOSIS — D2271 Melanocytic nevi of right lower limb, including hip: Secondary | ICD-10-CM | POA: Diagnosis not present

## 2021-01-19 DIAGNOSIS — D2262 Melanocytic nevi of left upper limb, including shoulder: Secondary | ICD-10-CM | POA: Diagnosis not present

## 2021-01-19 DIAGNOSIS — L814 Other melanin hyperpigmentation: Secondary | ICD-10-CM | POA: Diagnosis not present

## 2021-02-16 ENCOUNTER — Other Ambulatory Visit: Payer: Self-pay

## 2021-03-16 ENCOUNTER — Ambulatory Visit: Payer: Medicare Other | Attending: Internal Medicine

## 2021-03-16 ENCOUNTER — Other Ambulatory Visit: Payer: Self-pay

## 2021-03-16 DIAGNOSIS — Z23 Encounter for immunization: Secondary | ICD-10-CM

## 2021-03-16 MED ORDER — MODERNA COVID-19 BIVAL BOOSTER 50 MCG/0.5ML IM SUSP
INTRAMUSCULAR | 0 refills | Status: DC
  Filled 2021-03-16: qty 0.5, 1d supply, fill #0

## 2021-03-16 NOTE — Progress Notes (Signed)
° °  Covid-19 Vaccination Clinic  Name:  Kari Willis    MRN: [id number] DOB: Oct 24, 1950  03/16/2021  [name] was observed post Covid-19 immunization for 15 minutes without incident. She was provided with Vaccine Information Sheet and instruction to access the V-Safe system.   [name] was instructed to call 911 with any severe reactions post vaccine: Difficulty breathing  Swelling of face and throat  A fast heartbeat  A bad rash all over body  Dizziness and weakness   Immunizations Administered     Name Date Dose VIS Date Route   Moderna Covid-19 vaccine Bivalent Booster 03/16/2021  9:12 AM 0.5 mL 11/14/2020 Intramuscular   Manufacturer: Levan Hurst   Lot: [id number]   NDC: [place], PharmD, MBA Clinical Acute Care Pharmacist

## 2021-06-04 ENCOUNTER — Other Ambulatory Visit: Payer: Self-pay | Admitting: General Surgery

## 2021-06-04 DIAGNOSIS — R928 Other abnormal and inconclusive findings on diagnostic imaging of breast: Secondary | ICD-10-CM

## 2021-06-08 ENCOUNTER — Ambulatory Visit
Admission: RE | Admit: 2021-06-08 | Discharge: 2021-06-08 | Disposition: A | Discharge status: Home / Self Care | Payer: Medicare Other | Source: Ambulatory Visit | Attending: Surgery | Admitting: Surgery

## 2021-06-08 DIAGNOSIS — R922 Inconclusive mammogram: Secondary | ICD-10-CM | POA: Diagnosis not present

## 2021-06-08 DIAGNOSIS — R928 Other abnormal and inconclusive findings on diagnostic imaging of breast: Secondary | ICD-10-CM

## 2021-06-08 DIAGNOSIS — R921 Mammographic calcification found on diagnostic imaging of breast: Secondary | ICD-10-CM | POA: Diagnosis not present

## 2021-08-05 DIAGNOSIS — H40053 Ocular hypertension, bilateral: Secondary | ICD-10-CM | POA: Diagnosis not present

## 2021-08-05 DIAGNOSIS — H2513 Age-related nuclear cataract, bilateral: Secondary | ICD-10-CM | POA: Diagnosis not present

## 2021-08-11 DIAGNOSIS — Z20822 Contact with and (suspected) exposure to covid-19: Secondary | ICD-10-CM | POA: Diagnosis not present

## 2021-11-03 DIAGNOSIS — D2262 Melanocytic nevi of left upper limb, including shoulder: Secondary | ICD-10-CM | POA: Diagnosis not present

## 2021-11-03 DIAGNOSIS — Z08 Encounter for follow-up examination after completed treatment for malignant neoplasm: Secondary | ICD-10-CM | POA: Diagnosis not present

## 2021-11-03 DIAGNOSIS — Z85828 Personal history of other malignant neoplasm of skin: Secondary | ICD-10-CM | POA: Diagnosis not present

## 2021-11-03 DIAGNOSIS — D2261 Melanocytic nevi of right upper limb, including shoulder: Secondary | ICD-10-CM | POA: Diagnosis not present

## 2021-11-03 DIAGNOSIS — D2272 Melanocytic nevi of left lower limb, including hip: Secondary | ICD-10-CM | POA: Diagnosis not present

## 2022-01-04 ENCOUNTER — Ambulatory Visit (INDEPENDENT_AMBULATORY_CARE_PROVIDER_SITE_OTHER): Payer: Medicare Other

## 2022-01-04 VITALS — Ht 60.05 in | Wt 132.0 lb

## 2022-01-04 DIAGNOSIS — Z0000 Encounter for general adult medical examination without abnormal findings: Secondary | ICD-10-CM | POA: Diagnosis not present

## 2022-01-04 NOTE — Progress Notes (Addendum)
Subjective:   Kari Willis is a 71 y.o. female who presents for Medicare Annual (Subsequent) preventive examination.  Review of Systems    No ROS.  Medicare Wellness Virtual Visit.  Visual/audio telehealth visit, UTA vital signs.   See social history for additional risk factors.   Cardiac Risk Factors include: advanced age (>61mn, >>68women)     Objective:    Today's Vitals   01/04/22 1011  Weight: 132 lb (59.9 kg)  Height: 5' 0.05" (1.525 m)   Body mass index is 25.74 kg/m.     01/04/2022   10:11 AM 12/15/2020    3:25 PM 11/16/2016   10:56 AM 11/17/2015   10:13 AM 10/15/2015    9:21 AM 11/18/2014    9:31 AM  Advanced Directives  Does Patient Have a Medical Advance Directive? Yes Yes No No No No  Type of AParamedicof ARedbyLiving will HPurvisLiving will      Does patient want to make changes to medical advance directive? No - Patient declined No - Patient declined      Copy of [place] Chart? No - copy requested No - copy requested      Would patient like information on creating a medical advance directive?    No - patient declined information No - patient declined information Yes - Educational materials given    Current Medications (verified)  Allergies (verified) Demerol [meperidine]   History: Past Medical History:  Diagnosis Date   [place]cer [place]   breast   Eye pressure    "high eye pressure"   Headache    Kidney stones    Personal history of radiation therapy    Past Surgical History:  Procedure Laterality Date   BREAST BIOPSY     BREAST LUMPECTOMY  dec 2013   HERNIA REPAIR     Family History  Problem Relation Age of Onset   Cancer Mother    Heart disease Father    Heart disease Sister    Heart disease Brother    Stroke Paternal Aunt    Social History   Socioeconomic History   Marital status: Married    Spouse name: Not on file   Number of children: Not on  file   Years of education: Not on file   Highest education level: Not on file  Occupational History   Not on file  Tobacco Use   Smoking status: Never   Smokeless tobacco: Never  Substance and Sexual Activity   Alcohol use: Yes    Comment: one glass of wine every 6 months   Drug use: No   Sexual activity: Never  Other Topics Concern   Not on file  Social History Narrative   Married.   Retired from [employer] Fall 2015      She is a DNR- yellow sheet given to pt on 10/14/14   Social Determinants of Health   Financial Resource Strain: Low Risk  (01/04/2022)   Overall Financial Resource Strain (CARDIA)    Difficulty of Paying Living Expenses: Not hard at all  Food Insecurity: No Food Insecurity (01/04/2022)   Hunger Vital Sign    Worried About Running Out of Food in the Last Year: Never true    Ran Out of Food in the Last Year: Never true  Transportation Needs: No Transportation Needs (01/04/2022)   PRAPARE - THydrologist(Medical):  No    Lack of Transportation (Non-Medical): No  Physical Activity: Not on file  Stress: No Stress Concern Present (01/04/2022)   [place]    Feeling of Stress : Not at all  Social Connections: Unknown (01/04/2022)   Social Connection and Isolation Panel [NHANES]    Frequency of Communication with Friends and Family: More than three times a week    Frequency of Social Gatherings with Friends and Family: More than three times a week    Attends Religious Services: Not on Advertising copywriter or Organizations: Not on file    Attends Archivist Meetings: Not on file    Marital Status: Married    Tobacco Counseling Counseling given: Not Answered   Clinical Intake:  Pre-visit preparation completed: Yes        Diabetes: No  How often do you need to have someone help you when you read instructions, pamphlets, or  other written materials from your doctor or pharmacy?: 1 - Never  Interpreter Needed?: No      Activities of Daily Living    01/04/2022   10:12 AM  In your present state of health, do you have any difficulty performing the following activities:  Hearing? 0  Vision? 0  Difficulty concentrating or making decisions? 0  Walking or climbing stairs? 0  Dressing or bathing? 0  Doing errands, shopping? 0  Preparing Food and eating ? N  Using the Toilet? N  In the past six months, have you accidently leaked urine? N  Do you have problems with loss of bowel control? N  Managing your Medications? N  Managing your Finances? N  Housekeeping or managing your Housekeeping? N    Patient Care Team: Crecencio Mc, MD as PCP - General (Internal Medicine) Lloyd Huger, MD as Consulting Physician (Oncology) Alphonsa Overall, MD as Consulting Physician (General Surgery) Lucious Groves, OD as Referring Physician (Optometry)  Indicate any recent Medical Services you may have received from other than [facility] providers in the past year (date may be approximate).     Assessment:   This is a routine wellness examination for Kari Willis.  I connected with  Kari Willis on 01/04/22 by a audio enabled telemedicine application and verified that I am speaking with the correct person using two identifiers.  Patient Location: Home  Provider Location: Office/Clinic  I discussed the limitations of evaluation and management by telemedicine. The patient expressed understanding and agreed to proceed.   Hearing/Vision screen Hearing Screening - Comments:: Patient is able to hear conversational tones without difficulty. No issues reported. Vision Screening - Comments:: [facility], Dr. Hester Mates  2x visits per year High pressure Wears glasses  Dietary issues and exercise activities discussed: Current Exercise Habits: Home exercise routine, Type of exercise: calisthenics;walking (Pilates,  recumbent bike), Time (Minutes): 60, Frequency (Times/Week): 4, Weekly Exercise (Minutes/Week): 240, Intensity: Mild Monitors salt and sugar intake Good water intake   Goals Addressed             This Visit's Progress    Maintain Healthy Lifestyle       Stay active Healthy diet       Depression Screen    01/04/2022   10:22 AM 12/15/2020    3:27 PM 09/30/2020    4:08 PM 10/17/2017    8:57 AM 10/15/2015    9:20 AM 10/14/2014    1:18 PM  PHQ 2/9 Scores  PHQ - 2 Score 0 0 0 0 0 0  PHQ- 9 Score   3       Fall Risk    01/04/2022   10:22 AM 12/15/2020   11:27 AM 09/30/2020    2:59 PM 10/17/2017    8:57 AM 10/15/2015    9:20 AM  Fall Risk   Falls in the past year? 0 0 0 No No  Number falls in past yr: 0 0 0    Injury with Fall? 0 0 0    Follow up Falls evaluation completed Falls evaluation completed Falls evaluation completed      [place]: Home free of loose throw rugs in walkways, pet beds, electrical cords, etc? Yes  Adequate lighting in your home to reduce risk of falls? Yes   ASSISTIVE DEVICES UTILIZED TO PREVENT FALLS: Life alert? No  Use of a cane, walker or w/c? No  Grab bars in the bathroom? No   TIMED UP AND GO: Was the test performed? No .   Cognitive Function: Patient is alert and oriented x3.  Volunteers at [facility].  Manages her own finances and medications.  100% independent.  Denies difficulty focusing, making decisions, memory loss.      10/15/2015    9:18 AM  MMSE - Mini Mental State Exam  Orientation to time 5  Orientation to Place 5  Registration 3  Attention/ Calculation 0  Recall 3  Language- name 2 objects 0  Language- repeat 1  Language- follow 3 step command 3  Language- read & follow direction 0  Write a sentence 0  Copy design 0  Total score 20        01/04/2022   10:38 AM  6CIT Screen  What Year? 0 points  What month? 0 points  What time? 0 points    Immunizations Immunization  History  Administered Date(s) Administered   Influenza, High Dose Seasonal PF 01/10/2020   Influenza,inj,Quad PF,6+ Mos 02/12/2015   Influenza-Unspecified 01/02/2018   Moderna Covid-19 Vaccine Bivalent Booster 34yr & up 03/16/2021   Moderna Sars-Covid-2 Vaccination 05/04/2019, 06/03/2019, 03/17/2020   Pneumococcal Conjugate-13 01/21/2015   Tdap 02/12/2015   Zoster, Live 04/11/2013   Flu Vaccine status: Due, Education has been provided regarding the importance of this vaccine. Advised may receive this vaccine at local pharmacy or Health Dept. Aware to provide a copy of the vaccination record if obtained from local pharmacy or Health Dept. Verbalized acceptance and understanding.  Pneumococcal vaccine status: Due, Education has been provided regarding the importance of this vaccine. Advised may receive this vaccine at local pharmacy or Health Dept. Aware to provide a copy of the vaccination record if obtained from local pharmacy or Health Dept. Verbalized acceptance and understanding.  Covid-19 vaccine status: Completed vaccines x4.  Shingrix Completed?: No.    Education has been provided regarding the importance of this vaccine. Patient has been advised to call insurance company to determine out of pocket expense if they have not yet received this vaccine. Advised may also receive vaccine at local pharmacy or Health Dept. Verbalized acceptance and understanding.  Screening Tests Health Maintenance  Topic Date Due   COVID-19 Vaccine (5 - Moderna series) 01/20/2022 (Originally 07/15/2021)   Pneumonia Vaccine 72 Years old (2 - PPSV23 or PCV20) 03/04/2022 (Originally 01/21/2016)   Zoster Vaccines- Shingrix (1 of 2) 03/04/2022 (Originally 06/20/1999)   INFLUENZA VACCINE  07/03/2022 (Originally 11/02/2021)   COLONOSCOPY (Pts 45-447yrInsurance coverage will need to  be confirmed)  02/09/2023   MAMMOGRAM  06/09/2023   TETANUS/TDAP  02/11/2025   DEXA SCAN  Completed   Hepatitis C Screening   Completed   HPV VACCINES  Aged Out   Health Maintenance There are no preventive care reminders to display for this patient.  Lung Cancer Screening: (Low Dose CT Chest recommended if Age 66-80 years, 30 pack-year currently smoking OR have quit w/in 15years.) does not qualify.   Hepatitis C Screening: Completed 2017.  Vision Screening: Recommended annual ophthalmology exams for early detection of glaucoma and other disorders of the eye.  Dental Screening: Recommended annual dental exams for proper oral hygiene  Community Resource Referral / Chronic Care Management: CRR required this visit?  No   CCM required this visit?  No      Plan:     I have personally reviewed and noted the following in the patient's chart:   Medical and social history Use of alcohol, tobacco or illicit drugs  Current medications and supplements including opioid prescriptions. Patient is not currently taking opioid prescriptions. Functional ability and status Nutritional status Physical activity Advanced directives List of other physicians Hospitalizations, surgeries, and ER visits in previous 12 months Vitals Screenings to include cognitive, depression, and falls Referrals and appointments  In addition, I have reviewed and discussed with patient certain preventive protocols, quality metrics, and best practice recommendations. A written personalized care plan for preventive services as well as general preventive health recommendations were provided to patient.     OBrien-Blaney, Keshayla Schrum L, LPN   [id number]      I have reviewed the above information and agree with above.   Deborra Medina, MD

## 2022-01-04 NOTE — Patient Instructions (Signed)
Kari Willis , Thank you for taking time to come for your Medicare Wellness Visit. I appreciate your ongoing commitment to your health goals. Please review the following plan we discussed and let me know if I can assist you in the future.   These are the goals we discussed:  Goals      Maintain Healthy Lifestyle     Stay active Healthy diet        This is a list of the screening recommended for you and due dates:  Health Maintenance  Topic Date Due   COVID-19 Vaccine (5 - Moderna series) 01/20/2022*   Pneumonia Vaccine (2 - PPSV23 or PCV20) 03/04/2022*   Zoster (Shingles) Vaccine (1 of 2) 03/04/2022*   Flu Shot  07/03/2022*   Colon Cancer Screening  02/09/2023   Mammogram  06/09/2023   Tetanus Vaccine  02/11/2025   DEXA scan (bone density measurement)  Completed   Hepatitis C Screening: USPSTF Recommendation to screen - Ages 17-79 yo.  Completed   HPV Vaccine  Aged Out  *Topic was postponed. The date shown is not the original due date.    Advanced directives: End of life planning; Advance aging; Advanced directives discussed.  Copy of current HCPOA/Living Will requested.    Conditions/risks identified: none new  Next appointment: Follow up in one year for your annual wellness visit    Preventive Care 65 Years and Older, Female Preventive care refers to lifestyle choices and visits with your health care provider that can promote health and wellness. What does preventive care include? A yearly physical exam. This is also called an annual well check. Dental exams once or twice a year. Routine eye exams. Ask your health care provider how often you should have your eyes checked. Personal lifestyle choices, including: Daily care of your teeth and gums. Regular physical activity. Eating a healthy diet. Avoiding tobacco and drug use. Limiting alcohol use. Practicing safe sex. Taking low-dose aspirin every day. Taking vitamin and mineral supplements as recommended by your  health care provider. What happens during an annual well check? The services and screenings done by your health care provider during your annual well check will depend on your age, overall health, lifestyle risk factors, and family history of disease. Counseling  Your health care provider may ask you questions about your: Alcohol use. Tobacco use. Drug use. Emotional well-being. Home and relationship well-being. Sexual activity. Eating habits. History of falls. Memory and ability to understand (cognition). Work and work Statistician. Reproductive health. Screening  You may have the following tests or measurements: Height, weight, and BMI. Blood pressure. Lipid and cholesterol levels. These may be checked every 5 years, or more frequently if you are over 98 years old. Skin check. Lung cancer screening. You may have this screening every year starting at age 66 if you have a 30-pack-year history of smoking and currently smoke or have quit within the past 15 years. Fecal occult blood test (FOBT) of the stool. You may have this test every year starting at age 39. Flexible sigmoidoscopy or colonoscopy. You may have a sigmoidoscopy every 5 years or a colonoscopy every 10 years starting at age 21. Hepatitis C blood test. Hepatitis B blood test. Sexually transmitted disease (STD) testing. Diabetes screening. This is done by checking your blood sugar (glucose) after you have not eaten for a while (fasting). You may have this done every 1-3 years. Bone density scan. This is done to screen for osteoporosis. You may have this done starting at  age 75. Mammogram. This may be done every 1-2 years. Talk to your health care provider about how often you should have regular mammograms. Talk with your health care provider about your test results, treatment options, and if necessary, the need for more tests. Vaccines  Your health care provider may recommend certain vaccines, such as: Influenza vaccine.  This is recommended every year. Tetanus, diphtheria, and acellular pertussis (Tdap, Td) vaccine. You may need a Td booster every 10 years. Zoster vaccine. You may need this after age 55. Pneumococcal 13-valent conjugate (PCV13) vaccine. One dose is recommended after age 65. Pneumococcal polysaccharide (PPSV23) vaccine. One dose is recommended after age 75. Talk to your health care provider about which screenings and vaccines you need and how often you need them. This information is not intended to replace advice given to you by your health care provider. Make sure you discuss any questions you have with your health care provider. Document Released: 04/17/2015 Document Revised: 12/09/2015 Document Reviewed: 01/20/2015 Elsevier Interactive Patient Education  2017 [place] Prevention in the Home Falls can cause injuries. They can happen to people of all ages. There are many things you can do to make your home safe and to help prevent falls. What can I do on the outside of my home? Regularly fix the edges of walkways and driveways and fix any cracks. Remove anything that might make you trip as you walk through a door, such as a raised step or threshold. Trim any bushes or trees on the path to your home. Use bright outdoor lighting. Clear any walking paths of anything that might make someone trip, such as rocks or tools. Regularly check to see if handrails are loose or broken. Make sure that both sides of any steps have handrails. Any raised decks and porches should have guardrails on the edges. Have any leaves, snow, or ice cleared regularly. Use sand or salt on walking paths during winter. Clean up any spills in your garage right away. This includes oil or grease spills. What can I do in the bathroom? Use night lights. Install grab bars by the toilet and in the tub and shower. Do not use towel bars as grab bars. Use non-skid mats or decals in the tub or shower. If you need to sit  down in the shower, use a plastic, non-slip stool. Keep the floor dry. Clean up any water that spills on the floor as soon as it happens. Remove soap buildup in the tub or shower regularly. Attach bath mats securely with double-sided non-slip rug tape. Do not have throw rugs and other things on the floor that can make you trip. What can I do in the bedroom? Use night lights. Make sure that you have a light by your bed that is easy to reach. Do not use any sheets or blankets that are too big for your bed. They should not hang down onto the floor. Have a firm chair that has side arms. You can use this for support while you get dressed. Do not have throw rugs and other things on the floor that can make you trip. What can I do in the kitchen? Clean up any spills right away. Avoid walking on wet floors. Keep items that you use a lot in easy-to-reach places. If you need to reach something above you, use a strong step stool that has a grab bar. Keep electrical cords out of the way. Do not use floor polish or wax that makes floors  slippery. If you must use wax, use non-skid floor wax. Do not have throw rugs and other things on the floor that can make you trip. What can I do with my stairs? Do not leave any items on the stairs. Make sure that there are handrails on both sides of the stairs and use them. Fix handrails that are broken or loose. Make sure that handrails are as long as the stairways. Check any carpeting to make sure that it is firmly attached to the stairs. Fix any carpet that is loose or worn. Avoid having throw rugs at the top or bottom of the stairs. If you do have throw rugs, attach them to the floor with carpet tape. Make sure that you have a light switch at the top of the stairs and the bottom of the stairs. If you do not have them, ask someone to add them for you. What else can I do to help prevent falls? Wear shoes that: Do not have high heels. Have rubber bottoms. Are  comfortable and fit you well. Are closed at the toe. Do not wear sandals. If you use a stepladder: Make sure that it is fully opened. Do not climb a closed stepladder. Make sure that both sides of the stepladder are locked into place. Ask someone to hold it for you, if possible. Clearly mark and make sure that you can see: Any grab bars or handrails. First and last steps. Where the edge of each step is. Use tools that help you move around (mobility aids) if they are needed. These include: Canes. Walkers. Scooters. Crutches. Turn on the lights when you go into a dark area. Replace any light bulbs as soon as they burn out. Set up your furniture so you have a clear path. Avoid moving your furniture around. If any of your floors are uneven, fix them. If there are any pets around you, be aware of where they are. Review your medicines with your doctor. Some medicines can make you feel dizzy. This can increase your chance of falling. Ask your doctor what other things that you can do to help prevent falls. This information is not intended to replace advice given to you by your health care provider. Make sure you discuss any questions you have with your health care provider. Document Released: 01/15/2009 Document Revised: 08/27/2015 Document Reviewed: 04/25/2014 Elsevier Interactive Patient Education  2017 [employer].

## 2022-02-09 DIAGNOSIS — H2513 Age-related nuclear cataract, bilateral: Secondary | ICD-10-CM | POA: Diagnosis not present

## 2022-02-09 DIAGNOSIS — H40053 Ocular hypertension, bilateral: Secondary | ICD-10-CM | POA: Diagnosis not present

## 2022-04-13 ENCOUNTER — Other Ambulatory Visit: Payer: Self-pay | Admitting: General Surgery

## 2022-04-13 DIAGNOSIS — R921 Mammographic calcification found on diagnostic imaging of breast: Secondary | ICD-10-CM

## 2022-06-13 ENCOUNTER — Ambulatory Visit
Admission: RE | Admit: 2022-06-13 | Discharge: 2022-06-13 | Disposition: A | Discharge status: Home / Self Care | Payer: Medicare Other | Source: Ambulatory Visit | Attending: General Surgery | Admitting: General Surgery

## 2022-06-13 DIAGNOSIS — R921 Mammographic calcification found on diagnostic imaging of breast: Secondary | ICD-10-CM

## 2022-06-13 DIAGNOSIS — Z853 Personal history of malignant neoplasm of breast: Secondary | ICD-10-CM | POA: Diagnosis not present

## 2022-07-05 IMAGING — MG MM DIGITAL SCREENING BILAT W/ TOMO AND CAD
6 of 10 series · 6 of 30 positions shown · non-contrast
Comparison: Previous exam(s).

CLINICAL DATA: Screening.

EXAM:
DIGITAL SCREENING BILATERAL MAMMOGRAM WITH TOMOSYNTHESIS AND CAD
TECHNIQUE: Bilateral screening digital craniocaudal and mediolateral oblique
mammograms were obtained. Bilateral screening digital breast
tomosynthesis was performed. The images were evaluated with
computer-aided detection.

[R MLO synth-2D]
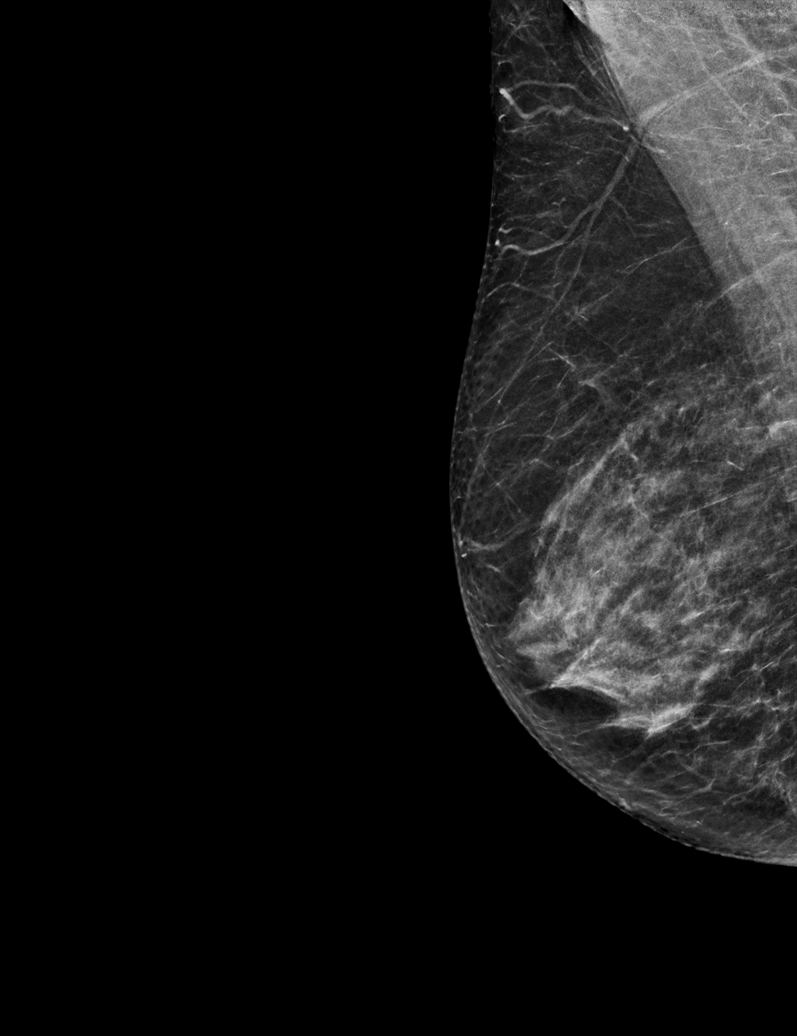

[L MLO synth-2D (1 of 2)]
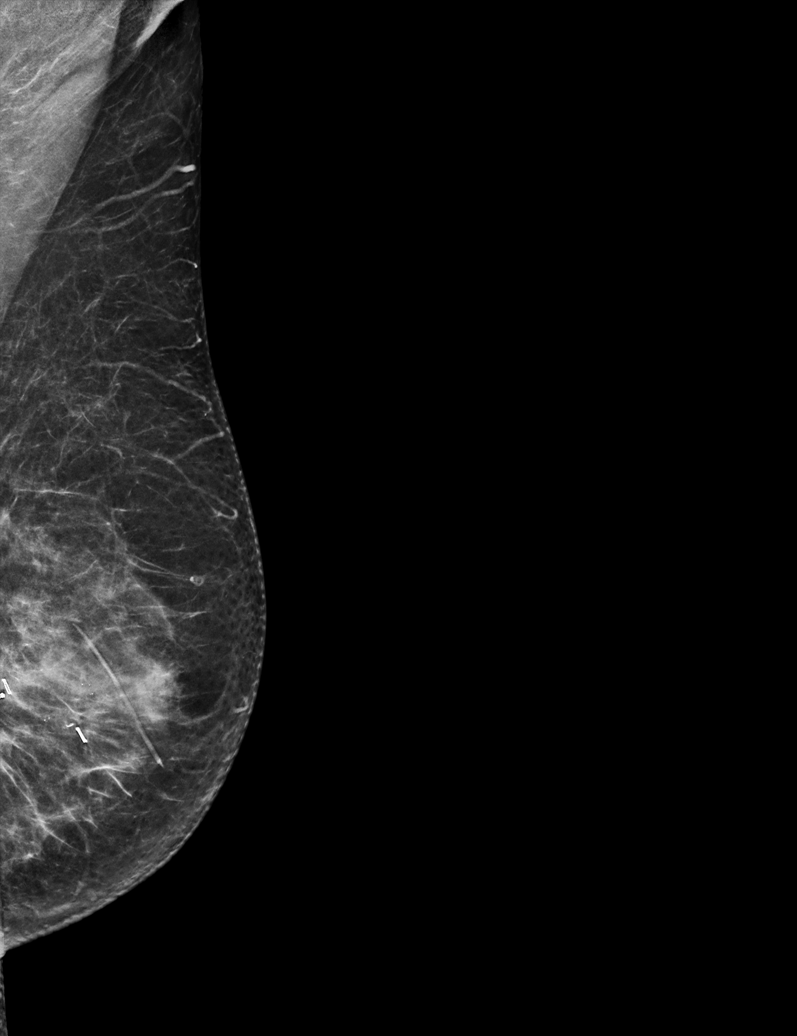

[R CC synth-2D]
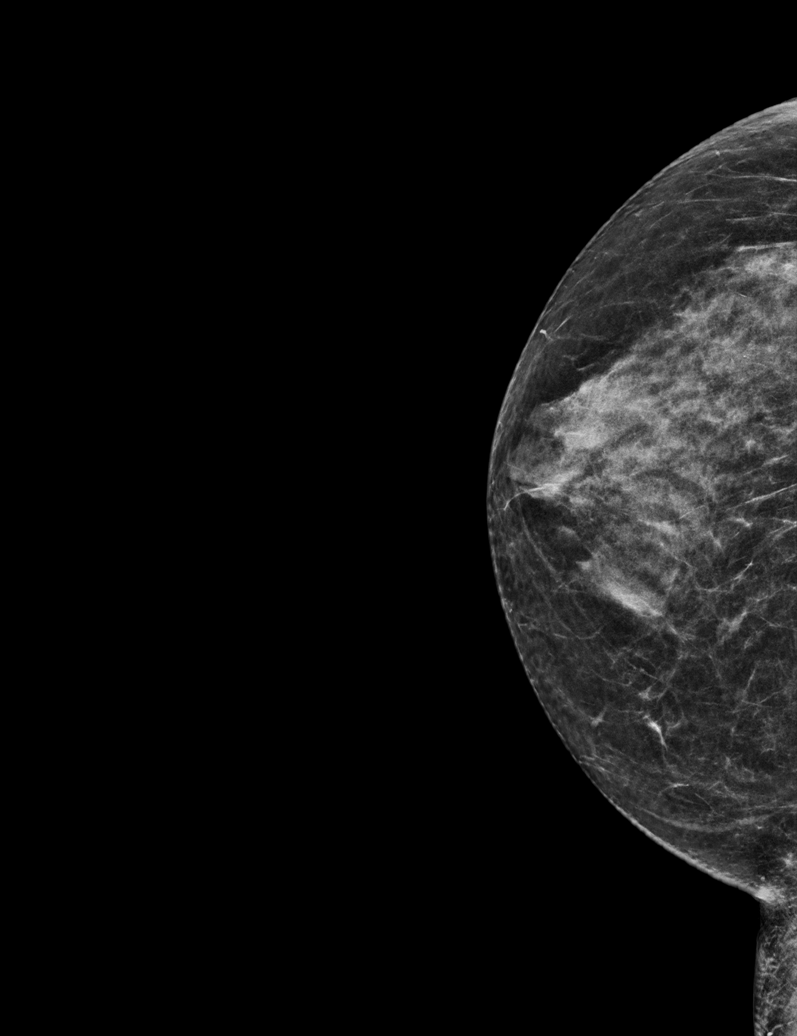

[L CC synth-2D]
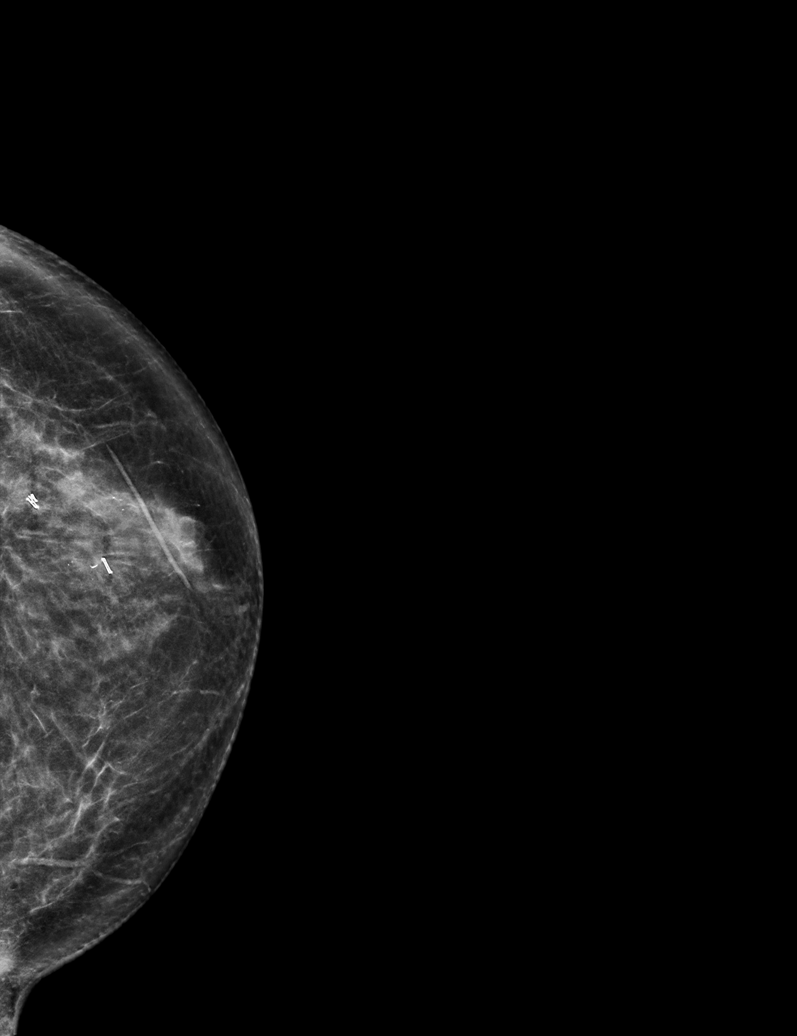

[L MLO synth-2D (2 of 2)]
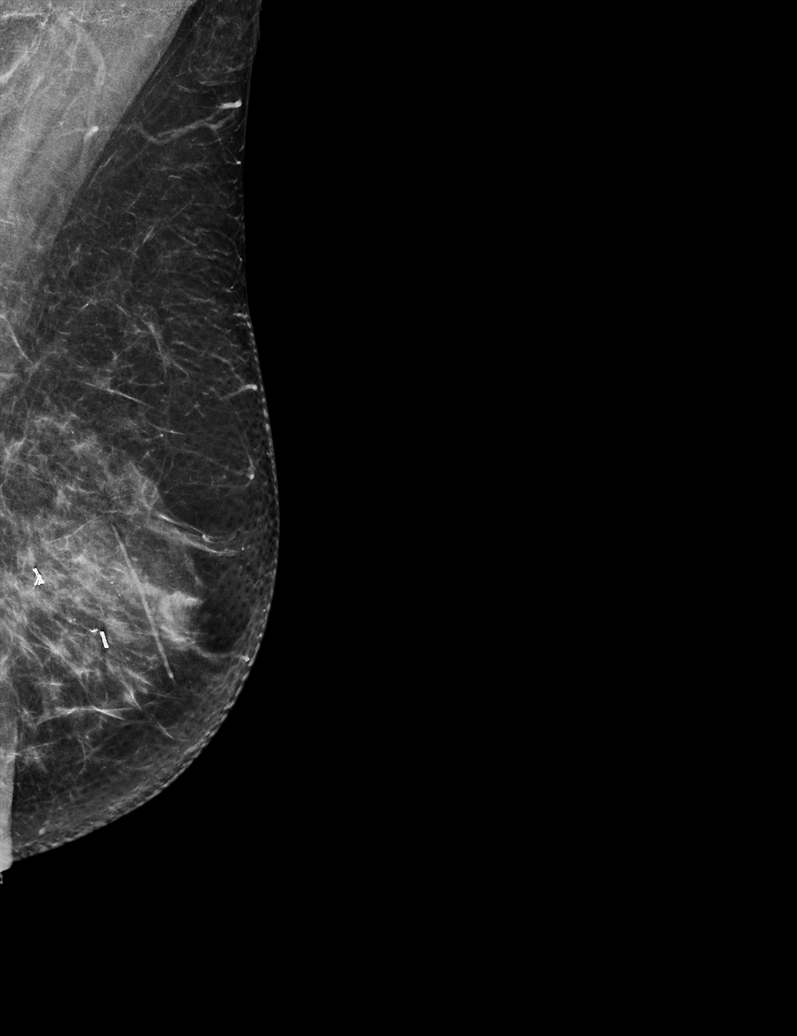

[L CC tomo · tomo slice 31/61.0]
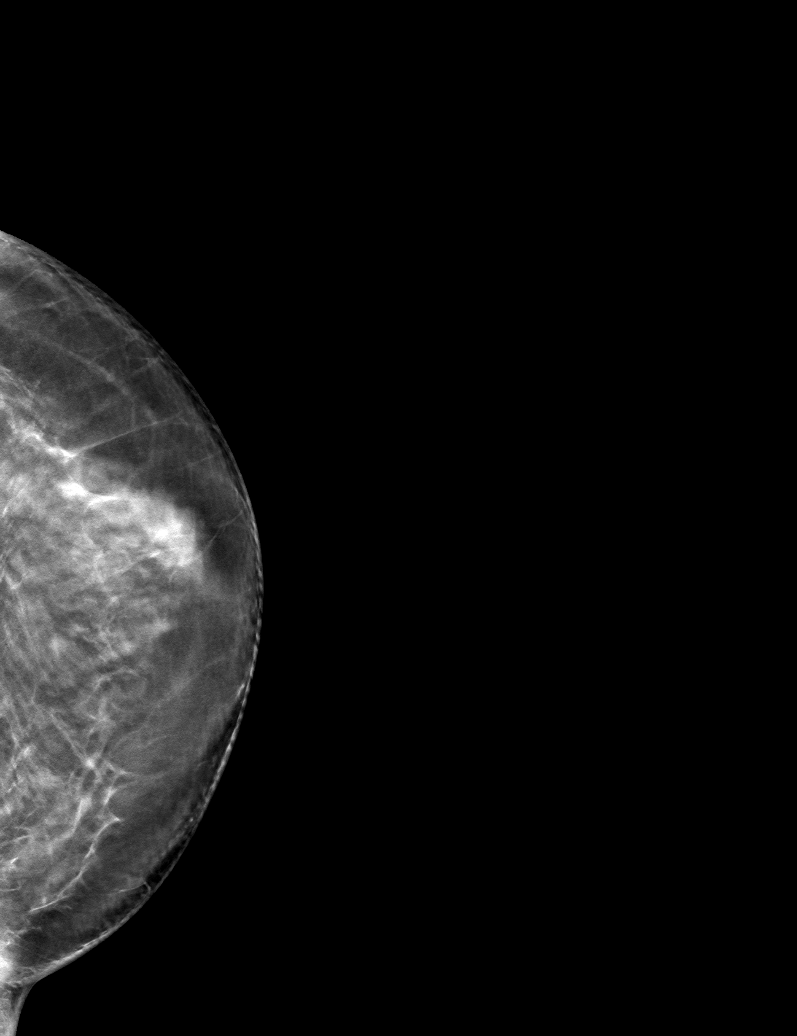

[6 of 30 positions shown; findings below may reference images not displayed]

ACR Breast Density Category c: The breast tissue is heterogeneously
dense, which may obscure small masses.
FINDINGS: In the left breast, calcifications warrant further evaluation. In
the right breast, no findings suspicious for malignancy.
IMPRESSION: Further evaluation is suggested for calcifications in the left
breast.

RECOMMENDATION:
Diagnostic mammogram of the left breast. (Code:AS-B-FFA)

The patient will be contacted regarding the findings, and additional
imaging will be scheduled.

BI-RADS CATEGORY  0: Incomplete. Need additional imaging evaluation
and/or prior mammograms for comparison.

## 2022-08-08 DIAGNOSIS — H524 Presbyopia: Secondary | ICD-10-CM | POA: Diagnosis not present

## 2022-08-08 DIAGNOSIS — H2513 Age-related nuclear cataract, bilateral: Secondary | ICD-10-CM | POA: Diagnosis not present

## 2022-08-08 DIAGNOSIS — H40053 Ocular hypertension, bilateral: Secondary | ICD-10-CM | POA: Diagnosis not present

## 2022-11-09 DIAGNOSIS — D2272 Melanocytic nevi of left lower limb, including hip: Secondary | ICD-10-CM | POA: Diagnosis not present

## 2022-11-09 DIAGNOSIS — Z85828 Personal history of other malignant neoplasm of skin: Secondary | ICD-10-CM | POA: Diagnosis not present

## 2022-11-09 DIAGNOSIS — D225 Melanocytic nevi of trunk: Secondary | ICD-10-CM | POA: Diagnosis not present

## 2022-11-09 DIAGNOSIS — D2262 Melanocytic nevi of left upper limb, including shoulder: Secondary | ICD-10-CM | POA: Diagnosis not present

## 2022-11-09 DIAGNOSIS — D2261 Melanocytic nevi of right upper limb, including shoulder: Secondary | ICD-10-CM | POA: Diagnosis not present

## 2023-02-07 DIAGNOSIS — H2513 Age-related nuclear cataract, bilateral: Secondary | ICD-10-CM | POA: Diagnosis not present

## 2023-02-07 DIAGNOSIS — H40053 Ocular hypertension, bilateral: Secondary | ICD-10-CM | POA: Diagnosis not present

## 2023-04-18 ENCOUNTER — Other Ambulatory Visit: Payer: Self-pay | Admitting: Internal Medicine

## 2023-04-18 DIAGNOSIS — Z1231 Encounter for screening mammogram for malignant neoplasm of breast: Secondary | ICD-10-CM

## 2023-05-10 ENCOUNTER — Encounter: Payer: Self-pay | Admitting: Internal Medicine

## 2023-05-10 ENCOUNTER — Ambulatory Visit: Payer: Medicare Other

## 2023-05-10 ENCOUNTER — Ambulatory Visit (INDEPENDENT_AMBULATORY_CARE_PROVIDER_SITE_OTHER): Payer: Medicare Other | Admitting: Internal Medicine

## 2023-05-10 VITALS — BP 120/66 | HR 59 | Temp 97.6°F | Ht 60.5 in | Wt 133.8 lb

## 2023-05-10 DIAGNOSIS — Z9189 Other specified personal risk factors, not elsewhere classified: Secondary | ICD-10-CM | POA: Diagnosis not present

## 2023-05-10 DIAGNOSIS — C44519 Basal cell carcinoma of skin of other part of trunk: Secondary | ICD-10-CM | POA: Diagnosis not present

## 2023-05-10 DIAGNOSIS — Z1211 Encounter for screening for malignant neoplasm of colon: Secondary | ICD-10-CM

## 2023-05-10 DIAGNOSIS — R5383 Other fatigue: Secondary | ICD-10-CM | POA: Diagnosis not present

## 2023-05-10 DIAGNOSIS — R7301 Impaired fasting glucose: Secondary | ICD-10-CM | POA: Diagnosis not present

## 2023-05-10 DIAGNOSIS — E559 Vitamin D deficiency, unspecified: Secondary | ICD-10-CM | POA: Diagnosis not present

## 2023-05-10 DIAGNOSIS — Z853 Personal history of malignant neoplasm of breast: Secondary | ICD-10-CM

## 2023-05-10 DIAGNOSIS — Z780 Asymptomatic menopausal state: Secondary | ICD-10-CM

## 2023-05-10 DIAGNOSIS — E7800 Pure hypercholesterolemia, unspecified: Secondary | ICD-10-CM | POA: Diagnosis not present

## 2023-05-10 LAB — CBC WITH DIFFERENTIAL/PLATELET
Basophils Absolute: 0.1 10*3/uL (ref 0.0–0.1)
Basophils Relative: 1.2 % (ref 0.0–3.0)
Eosinophils Absolute: 0.1 10*3/uL (ref 0.0–0.7)
Eosinophils Relative: 2.3 % (ref 0.0–5.0)
HCT: 41.9 % (ref 36.0–46.0)
Hemoglobin: 14 g/dL (ref 12.0–15.0)
Lymphocytes Relative: 36.2 % (ref 12.0–46.0)
Lymphs Abs: 1.7 10*3/uL (ref 0.7–4.0)
MCHC: 33.3 g/dL (ref 30.0–36.0)
MCV: 96.2 fL (ref 78.0–100.0)
Monocytes Absolute: 0.4 10*3/uL (ref 0.1–1.0)
Monocytes Relative: 8.6 % (ref 3.0–12.0)
Neutro Abs: 2.4 10*3/uL (ref 1.4–7.7)
Neutrophils Relative %: 51.7 % (ref 43.0–77.0)
Platelets: 190 10*3/uL (ref 150.0–400.0)
RBC: 4.36 Mil/uL (ref 3.87–5.11)
RDW: 13.3 % (ref 11.5–15.5)
WBC: 4.7 10*3/uL (ref 4.0–10.5)

## 2023-05-10 LAB — COMPREHENSIVE METABOLIC PANEL
ALT: 16 U/L (ref 0–35)
AST: 17 U/L (ref 0–37)
Albumin: 4.4 g/dL (ref 3.5–5.2)
Alkaline Phosphatase: 48 U/L (ref 39–117)
BUN: 18 mg/dL (ref 6–23)
CO2: 29 meq/L (ref 19–32)
Calcium: 9.5 mg/dL (ref 8.4–10.5)
Chloride: 100 meq/L (ref 96–112)
Creatinine, Ser: 0.87 mg/dL (ref 0.40–1.20)
GFR: 65.88 mL/min (ref >60.00)
Glucose, Bld: 96 mg/dL (ref 70–99)
Potassium: 4.2 meq/L (ref 3.5–5.1)
Sodium: 136 meq/L (ref 135–145)
Total Bilirubin: 0.9 mg/dL (ref 0.2–1.2)
Total Protein: 7.1 g/dL (ref 6.0–8.3)

## 2023-05-10 LAB — LIPID PANEL
Cholesterol: 232 mg/dL — ABNORMAL HIGH (ref 0–200)
HDL: 57 mg/dL
LDL Cholesterol: 156 mg/dL — ABNORMAL HIGH (ref 0–99)
NonHDL: 175.2
Total CHOL/HDL Ratio: 4
Triglycerides: 96 mg/dL (ref 0.0–149.0)
VLDL: 19.2 mg/dL (ref 0.0–40.0)

## 2023-05-10 LAB — TSH: TSH: 2.02 u[IU]/mL (ref 0.35–5.50)

## 2023-05-10 LAB — VITAMIN D 25 HYDROXY (VIT D DEFICIENCY, FRACTURES): VITD: 64.43 ng/mL (ref 30.00–100.00)

## 2023-05-10 LAB — HEMOGLOBIN A1C: Hgb A1c MFr Bld: 5.7 % (ref 4.6–6.5)

## 2023-05-10 LAB — LDL CHOLESTEROL, DIRECT: Direct LDL: 160 mg/dL

## 2023-05-10 NOTE — Progress Notes (Deleted)
   Subjective:  Patient ID: Kari Willis, female    DOB: 1950/08/05  Age: 73 y.o. MRN: [id number]  CC: The primary encounter diagnosis was Pure hypercholesterolemia. Diagnoses of Impaired fasting glucose and Other fatigue were also pertinent to this visit.   1) GERD: brought on tomato sauce ,  relieved with Tums   2) interrupted sleep:   HPI Kari Willis presents for establishment of care.  Seen once June 2022  1( history of breast cancer s/p lumpectomy 2013,  last mammogram march 2024 stable calcifications (followed by Dr Aron() History Kari Willis has a past medical history of Cancer (HCC) (oct 2013), Eye pressure, Headache, Kidney stones, and Personal history of radiation therapy.   She has a past surgical history that includes Breast lumpectomy (dec 2013); Breast biopsy; and Hernia repair.   Her family history includes Cancer in her mother; Heart disease in her brother, father, and sister; Stroke in her paternal aunt.She reports that she has never smoked. She has never used smokeless tobacco. She reports current alcohol use. She reports that she does not use drugs.  Outpatient Medications Prior to Visit  Medication Sig Dispense Refill   B Complex Vitamins (B COMPLEX-B12 PO) Take 1 tablet by mouth daily.     Calcium Carbonate-Vitamin D  (CALCIUM 600+D HIGH POTENCY PO) Take 1 tablet by mouth 2 (two) times daily.     IRON, FERROUS SULFATE, PO Take 65 mg by mouth 2 (two) times a week.     Multiple Vitamins-Minerals (CENTRUM SILVER ADULT 50+ PO) Take 1 tablet by mouth daily.     niacin 500 MG tablet Take 500 mg by mouth at bedtime.     Omega-3 Fatty Acids (FISH OIL) 1200 MG CAPS Take 1 capsule by mouth.     timolol (TIMOPTIC) 0.5 % ophthalmic solution Place 1 drop into both eyes daily.     vitamin C (ASCORBIC ACID) 500 MG tablet Take 500 mg by mouth 2 (two) times daily.     vitamin E 400 UNIT capsule Take 400 Units by mouth daily.     COVID-19 mRNA bivalent vaccine, Moderna, (MODERNA  COVID-19 BIVAL BOOSTER) 50 MCG/0.5ML injection Inject into the muscle. 0.5 mL 0   timolol (BETIMOL) 0.5 % ophthalmic solution Place 1 drop into both eyes 2 (two) times daily.     No facility-administered medications prior to visit.    Review of Systems:  Patient denies headache, fevers, malaise, unintentional weight loss, skin rash, eye pain, sinus congestion and sinus pain, sore throat, dysphagia,  hemoptysis , cough, dyspnea, wheezing, chest pain, palpitations, orthopnea, edema, abdominal pain, nausea, melena, diarrhea, constipation, flank pain, dysuria, hematuria, urinary  Frequency, nocturia, numbness, tingling, seizures,  Focal weakness, Loss of consciousness,  Tremor, insomnia, depression, anxiety, and suicidal ideation.     Objective:  BP 120/66   Pulse (!) 59   Temp 97.6 F (36.4 C) (Oral)   Ht 5' 0.5 (1.537 m)   Wt 133 lb 12.8 oz (60.7 kg)   SpO2 98%   BMI 25.70 kg/m   Physical Exam  Assessment & Plan:  Pure hypercholesterolemia  Impaired fasting glucose  Other fatigue     Follow-up: No follow-ups on file.   I provided 30 minutes during this encounter reviewing patient's last visit with previous provider,  most recent imaging studies and labs.  Provided counseling on the above mentioned problems and coordination of care.   Verneita LITTIE Kettering, MD

## 2023-05-10 NOTE — Progress Notes (Signed)
 Patient ID: Kari Willis, female    DOB: 1951-03-20  Age: 73 y.o. MRN: [id number]  The patient is here for follow up and  management of other chronic and acute problems.   The risk factors are reflected in the social history.  The roster of all physicians providing medical care to patient - is listed in the Snapshot section of the chart.  Activities of daily living:  The patient is 100% independent in all ADLs: dressing, toileting, feeding as well as independent mobility  Home safety : The patient has smoke detectors in the home. They wear seatbelts.  There are no firearms at home. There is no violence in the home.   There is no risks for hepatitis, STDs or HIV. There is no   history of blood transfusion. They have no travel history to infectious disease endemic areas of the world.  The patient has seen their dentist in the last six month. They have seen their eye doctor in the last year. They admit to slight hearing difficulty with regard to whispered voices and some television programs.  They have deferred audiologic testing in the last year.  They do not  have excessive sun exposure. Discussed the need for sun protection: hats, long sleeves and use of sunscreen if there is significant sun exposure.   Diet: the importance of a healthy diet is discussed. They do have a healthy diet.  The benefits of regular aerobic exercise were discussed. She exercises 7 days per week for 60  minutes.   Depression screen: there are no signs or vegative symptoms of depression- irritability, change in appetite, anhedonia, sadness/tearfullness.  Cognitive assessment: the patient manages all their financial and personal affairs and is actively engaged. They could relate day,date,year and events; recalled 2/3 objects at 3 minutes; performed clock-face test normally.  The following portions of the patient's history were reviewed and updated as appropriate: allergies, current medications, past family history,  past medical history,  past surgical history, past social history  and problem list.  Visual acuity was not assessed per patient preference since she has regular follow up with her ophthalmologist. Hearing and body mass index were assessed and reviewed.   During the course of the visit the patient was educated and counseled about appropriate screening and preventive services including : fall prevention , diabetes screening, nutrition counseling, colorectal cancer screening, and recommended immunizations.    CC: The primary encounter diagnosis was Pure hypercholesterolemia. Diagnoses of Impaired fasting glucose, Other fatigue, Colon cancer screening, Postmenopausal estrogen deficiency, Vitamin D  deficiency, At risk for glaucoma, Basal cell carcinoma (BCC) of back, and History of breast cancer were also pertinent to this visit.  1) GERD: brought on  by certain acidic foods, specifically tomato sauce ,  relieved with Tums   2)   Patient  has been having some trouble sleeping.  Wakes up after  2 to 3  hours and can't go back to sleep  . Bedtime hygiene reviewed,  Patient has been using an electronic book to read before bed.  Does not drink caffeinated beverages after 3 PM.  Only voids  bladder once per night.  No snoring partner. Does not drink alcohol to excess.  Not exercising excessively in the evening. Patient does have a history of anxiety but does not lie awake worrying about issues that cannot be resolved. Does not take stimulants. .   3) history of Breast CA:  has been cancer free for 10 yrs  and has been  released  from Dr Dia care,  due to mammogram in March  History Kari Willis has a past medical history of Cancer [facility]) (oct 2013), Eye pressure, Headache, Kidney stones, and Personal history of radiation therapy.   She has a past surgical history that includes Breast lumpectomy (dec 2013); Breast biopsy; and Hernia repair.   Her family history includes Cancer in her mother; Heart disease in  her brother, father, and sister; Stroke in her paternal aunt.She reports that she has never smoked. She has never used smokeless tobacco. She reports current alcohol use. She reports that she does not use drugs.  Outpatient Medications Prior to Visit  Medication Sig Dispense Refill   B Complex Vitamins (B COMPLEX-B12 PO) Take 1 tablet by mouth daily.     Calcium Carbonate-Vitamin D  (CALCIUM 600+D HIGH POTENCY PO) Take 1 tablet by mouth 2 (two) times daily.     IRON, FERROUS SULFATE, PO Take 65 mg by mouth 2 (two) times a week.     Multiple Vitamins-Minerals (CENTRUM SILVER ADULT 50+ PO) Take 1 tablet by mouth daily.     niacin 500 MG tablet Take 500 mg by mouth at bedtime.     Omega-3 Fatty Acids (FISH OIL) 1200 MG CAPS Take 1 capsule by mouth.     timolol (TIMOPTIC) 0.5 % ophthalmic solution Place 1 drop into both eyes daily.     vitamin C (ASCORBIC ACID) 500 MG tablet Take 500 mg by mouth 2 (two) times daily.     vitamin E 400 UNIT capsule Take 400 Units by mouth daily.     COVID-19 mRNA bivalent vaccine, Moderna, (MODERNA COVID-19 BIVAL BOOSTER) 50 MCG/0.5ML injection Inject into the muscle. 0.5 mL 0   timolol (BETIMOL) 0.5 % ophthalmic solution Place 1 drop into both eyes 2 (two) times daily.     No facility-administered medications prior to visit.    Review of Systems  Patient denies headache, fevers, malaise, unintentional weight loss, skin rash, eye pain, sinus congestion and sinus pain, sore throat, dysphagia,  hemoptysis , cough, dyspnea, wheezing, chest pain, palpitations, orthopnea, edema, abdominal pain, nausea, melena, diarrhea, constipation, flank pain, dysuria, hematuria, urinary  Frequency, nocturia, numbness, tingling, seizures,  Focal weakness, Loss of consciousness,  Tremor, insomnia, depression, anxiety, and suicidal ideation.     Objective:  BP 120/66   Pulse (!) 59   Temp 97.6 F (36.4 C) (Oral)   Ht 5' 0.5 (1.537 m)   Wt 133 lb 12.8 oz (60.7 kg)   SpO2 98%    BMI 25.70 kg/m   Physical Exam Vitals reviewed.  Constitutional:      General: She is not in acute distress.    Appearance: Normal appearance. She is normal weight. She is not ill-appearing, toxic-appearing or diaphoretic.  HENT:     Head: Normocephalic.  Eyes:     General: No scleral icterus.       Right eye: No discharge.        Left eye: No discharge.     Conjunctiva/sclera: Conjunctivae normal.  Cardiovascular:     Rate and Rhythm: Normal rate and regular rhythm.     Heart sounds: Normal heart sounds.  Pulmonary:     Effort: Pulmonary effort is normal. No respiratory distress.     Breath sounds: Normal breath sounds.  Musculoskeletal:        General: Normal range of motion.  Skin:    General: Skin is warm and dry.  Neurological:     General: No focal deficit present.  Mental Status: She is alert and oriented to person, place, and time. Mental status is at baseline.  Psychiatric:        Mood and Affect: Mood normal.        Behavior: Behavior normal.        Thought Content: Thought content normal.        Judgment: Judgment normal.      Assessment & Plan:  Pure hypercholesterolemia Assessment & Plan: 10 yr risk is 12% primarily due to age   Lab Results  Component Value Date   CHOL 232 (H) 05/10/2023   HDL 57.00 05/10/2023   LDLCALC 156 (H) 05/10/2023   LDLDIRECT 160.0 05/10/2023   TRIG 96.0 05/10/2023   CHOLHDL 4 05/10/2023     Orders: -     Lipid panel -     LDL cholesterol, direct  Impaired fasting glucose -     Comprehensive metabolic panel -     Hemoglobin A1c  Other fatigue -     CBC with Differential/Platelet -     TSH  Colon cancer screening -     Cologuard  Postmenopausal estrogen deficiency -     DG Bone Density; Future  Vitamin D  deficiency -     VITAMIN D  25 Hydroxy (Vit-D Deficiency, Fractures)  At risk for glaucoma Assessment & Plan: Managed by Dr Royetta  with timolol [facility] care)    and exams every 6 months    Basal  cell carcinoma (BCC) of back Assessment & Plan: Removed in 2023  .SABRA She  is avoiding sun exposure.  Checking vitamin  D level    History of breast cancer Assessment & Plan: S/p lumpectomy and XRT in 2013.  Mammogram is due in March        I provided 44 minutes of  face-to-face time during this encounter reviewing patient's current problems and past surgeries,  recent labs and imaging studies, providing counseling on the above mentioned problems , and coordination  of care .   Follow-up: No follow-ups on file.   Verneita LITTIE Kettering, MD

## 2023-05-10 NOTE — Assessment & Plan Note (Signed)
 S/p lumpectomy and XRT in 2013.  Mammogram is due in March

## 2023-05-10 NOTE — Assessment & Plan Note (Signed)
 Removed in 2023  .Kari Willis She  is avoiding sun exposure.  Checking vitamin  D level

## 2023-05-10 NOTE — Patient Instructions (Addendum)
 Welcome back!   I'm glad you have been well!  1) Your  DEXA scan  been ordered.  You are encouraged (required) to call to schedule your own  appointment at [facility]  , and their phone number is [phone]   2) I will initiate the order for your colon cancer screening  Test, the one called  Cologuard.  It will be delivered to your house, and you will send off a stool sample in the envelope it provides.     3) for your GERD,  try using Famotidine  20 mg BEFORE   any aggravating meal.     4) FOR YOUR SLEEP ISSUES,  I RECOMMEND THAT YOU TRY USING  Relaxium for insomnia  (as seen on TV commercials) . It contains:  Melatonin 5 mg  Chamomile 25 mg Passionflower extract 75 mg GABA 100 mg Ashwaganda extract 125 mg Magnesium citrate, glycinate, oxide (100 mg)  L tryptophan 500 mg Valerest (proprietary  ingredient ; probably valeria root extract)   Any individual ingredient in Relaxium may also work

## 2023-05-10 NOTE — Assessment & Plan Note (Signed)
 Managed by Dr Chip Coulter  with timolol [facility] care)    and exams every 6 months

## 2023-05-10 NOTE — Assessment & Plan Note (Signed)
10 yr risk is 12% primarily due to age   Lab Results  Component Value Date   CHOL 232 (H) 05/10/2023   HDL 57.00 05/10/2023   LDLCALC 156 (H) 05/10/2023   LDLDIRECT 160.0 05/10/2023   TRIG 96.0 05/10/2023   CHOLHDL 4 05/10/2023

## 2023-05-11 ENCOUNTER — Encounter: Payer: Self-pay | Admitting: Internal Medicine

## 2023-06-06 DIAGNOSIS — Z1211 Encounter for screening for malignant neoplasm of colon: Secondary | ICD-10-CM | POA: Diagnosis not present

## 2023-06-12 LAB — COLOGUARD: COLOGUARD: NEGATIVE

## 2023-06-21 ENCOUNTER — Ambulatory Visit
Admission: RE | Admit: 2023-06-21 | Discharge: 2023-06-21 | Disposition: A | Discharge status: Home / Self Care | Payer: Medicare Other | Source: Ambulatory Visit | Attending: Internal Medicine | Admitting: Internal Medicine

## 2023-06-21 DIAGNOSIS — Z1231 Encounter for screening mammogram for malignant neoplasm of breast: Secondary | ICD-10-CM

## 2023-06-28 ENCOUNTER — Other Ambulatory Visit: Payer: Medicare Other

## 2023-07-06 ENCOUNTER — Ambulatory Visit
Admission: RE | Admit: 2023-07-06 | Discharge: 2023-07-06 | Disposition: A | Discharge status: Home / Self Care | Payer: Medicare Other | Source: Ambulatory Visit | Attending: Internal Medicine | Admitting: Internal Medicine

## 2023-07-06 DIAGNOSIS — Z780 Asymptomatic menopausal state: Secondary | ICD-10-CM | POA: Diagnosis not present

## 2023-07-06 DIAGNOSIS — M810 Age-related osteoporosis without current pathological fracture: Secondary | ICD-10-CM | POA: Diagnosis not present

## 2023-07-09 ENCOUNTER — Encounter: Payer: Self-pay | Admitting: Internal Medicine

## 2023-08-10 DIAGNOSIS — H524 Presbyopia: Secondary | ICD-10-CM | POA: Diagnosis not present

## 2023-08-10 DIAGNOSIS — H40053 Ocular hypertension, bilateral: Secondary | ICD-10-CM | POA: Diagnosis not present

## 2023-08-10 DIAGNOSIS — H2513 Age-related nuclear cataract, bilateral: Secondary | ICD-10-CM | POA: Diagnosis not present

## 2023-10-10 ENCOUNTER — Encounter: Payer: Self-pay | Admitting: Internal Medicine

## 2023-10-10 ENCOUNTER — Ambulatory Visit (INDEPENDENT_AMBULATORY_CARE_PROVIDER_SITE_OTHER): Admitting: Internal Medicine

## 2023-10-10 VITALS — BP 100/58 | HR 74 | Ht 60.5 in | Wt 132.0 lb

## 2023-10-10 DIAGNOSIS — E7800 Pure hypercholesterolemia, unspecified: Secondary | ICD-10-CM

## 2023-10-10 DIAGNOSIS — M810 Age-related osteoporosis without current pathological fracture: Secondary | ICD-10-CM | POA: Diagnosis not present

## 2023-10-10 NOTE — Assessment & Plan Note (Signed)
 10 yr risk is 12% primarily due to age   Lab Results  Component Value Date   CHOL 232 (H) 05/10/2023   HDL 57.00 05/10/2023   LDLCALC 156 (H) 05/10/2023   LDLDIRECT 160.0 05/10/2023   TRIG 96.0 05/10/2023   CHOLHDL 4 05/10/2023

## 2023-10-10 NOTE — Progress Notes (Unsigned)
 Subjective:  Patient ID: Kari Willis, female    DOB: 11/17/1950  Age: 73 y.o. MRN: [id number]  CC: There were no encounter diagnoses.   HPI Kari Willis presents for  Chief Complaint  Patient presents with   Medical Management of Chronic Issues    3 month follow up       Outpatient Medications Prior to Visit  Medication Sig Dispense Refill   B Complex Vitamins (B COMPLEX-B12 PO) Take 1 tablet by mouth daily.     Calcium Carbonate-Vitamin D  (CALCIUM 600+D HIGH POTENCY PO) Take 1 tablet by mouth 2 (two) times daily.     IRON, FERROUS SULFATE, PO Take 65 mg by mouth 2 (two) times a week.     Multiple Vitamins-Minerals (CENTRUM SILVER ADULT 50+ PO) Take 1 tablet by mouth daily.     niacin 500 MG tablet Take 500 mg by mouth at bedtime.     Omega-3 Fatty Acids (FISH OIL) 1200 MG CAPS Take 1 capsule by mouth.     timolol (TIMOPTIC) 0.5 % ophthalmic solution Place 1 drop into both eyes daily. (Patient taking differently: Place 1 drop into both eyes 2 (two) times daily.)     vitamin C (ASCORBIC ACID) 500 MG tablet Take 500 mg by mouth 2 (two) times daily.     vitamin E 400 UNIT capsule Take 400 Units by mouth daily.     No facility-administered medications prior to visit.    Review of Systems;  Patient denies headache, fevers, malaise, unintentional weight loss, skin rash, eye pain, sinus congestion and sinus pain, sore throat, dysphagia,  hemoptysis , cough, dyspnea, wheezing, chest pain, palpitations, orthopnea, edema, abdominal pain, nausea, melena, diarrhea, constipation, flank pain, dysuria, hematuria, urinary  Frequency, nocturia, numbness, tingling, seizures,  Focal weakness, Loss of consciousness,  Tremor, insomnia, depression, anxiety, and suicidal ideation.      Objective:  BP (!) 100/58   Pulse 74   Ht 5' 0.5 (1.537 m)   Wt 132 lb (59.9 kg)   SpO2 97%   BMI 25.36 kg/m   BP Readings from Last 3 Encounters:  10/10/23 (!) 100/58  05/10/23 120/66  09/30/20  116/64    Wt Readings from Last 3 Encounters:  10/10/23 132 lb (59.9 kg)  05/10/23 133 lb 12.8 oz (60.7 kg)  01/04/22 132 lb (59.9 kg)    Physical Exam  Lab Results  Component Value Date   HGBA1C 5.7 05/10/2023   HGBA1C 5.8 10/07/2020    Lab Results  Component Value Date   CREATININE 0.87 05/10/2023   CREATININE 0.98 10/07/2020   CREATININE 0.90 10/17/2017    Lab Results  Component Value Date   WBC 4.7 05/10/2023   HGB 14.0 05/10/2023   HCT 41.9 05/10/2023   PLT 190.0 05/10/2023   GLUCOSE 96 05/10/2023   CHOL 232 (H) 05/10/2023   TRIG 96.0 05/10/2023   HDL 57.00 05/10/2023   LDLDIRECT 160.0 05/10/2023   LDLCALC 156 (H) 05/10/2023   ALT 16 05/10/2023   AST 17 05/10/2023   NA 136 05/10/2023   K 4.2 05/10/2023   CL 100 05/10/2023   CREATININE 0.87 05/10/2023   BUN 18 05/10/2023   CO2 29 05/10/2023   TSH 2.02 05/10/2023   HGBA1C 5.7 05/10/2023    DG Bone Density Result Date: 07/06/2023 EXAM: DUAL X-RAY ABSORPTIOMETRY (DXA) FOR BONE MINERAL DENSITY IMPRESSION: Your patient Kari Willis completed a BMD test on 07/06/2023 using the [employer] iDXA DXA System (software version: 14.10) manufactured by  [employer] engineer. The following summarizes the results of our evaluation. Technologist:VLM PATIENT BIOGRAPHICAL: Name: Kari Willis, Kari Willis Patient ID: [id number] Birth Date: 1950-05-10 Height: 60.5 in. Gender: Female Exam Date: 07/06/2023 Weight: 133.8 lbs. Indications: Caucasian, Postmenopausal Fractures: Treatments: calcium w/ vit D DENSITOMETRY RESULTS: Site      Region     Measured Date Measured Age WHO Classification Young Adult T-score BMD         %Change vs. Previous Significant Change (*) AP Spine L1-L4 07/06/20[age].0 Osteoporosis -2.5 0.882 g/cm2 - - DualFemur Neck Right 07/06/2023 [age] Osteoporosis -2.7 0.664 g/cm2 - - ASSESSMENT: The BMD measured at Femur Neck Right is 0.664 g/cm2 with a T-score of -2.7. This patient is considered osteoporotic according to World Health  Organization [facility]) criteria. The scan quality is good. World Science writer [facility]) criteria for post-menopausal, Caucasian Women: Normal:                   T-score at or above -1 SD Osteopenia/low bone mass: T-score between -1 and -2.5 SD Osteoporosis:             T-score at or below -2.5 SD RECOMMENDATIONS: 1. All patients should optimize calcium and vitamin D  intake. 2. Consider FDA-approved medical therapies in postmenopausal women and men aged 74 years and older, based on the following: a. A hip or vertebral(clinical or morphometric) fracture b. T-score < -2.5 at the femoral neck or spine after appropriate evaluation to exclude secondary causes c. Low bone mass (T-score between -1.0 and -2.5 at the femoral neck or spine) and a 10-year probability of a hip fracture > 3% or a 10-year probability of a major osteoporosis-related fracture > 20% based on the [place] -adapted WHO algorithm 3. Clinician judgment and/or patient preferences may indicate treatment for people with 10-year fracture probabilities above or below these levels FOLLOW-UP: People with diagnosed cases of osteoporosis or at high risk for fracture should have regular bone mineral density tests. For patients eligible for Medicare, routine testing is allowed once every 2 years. The testing frequency can be increased to one year for patients who have rapidly progressing disease, those who are receiving or discontinuing medical therapy to restore bone mass, or have additional risk factors. I have reviewed this report, and agree with the above findings. [facility] Electronically Signed   By: Rosaline Collet M.D.   On: 07/06/2023 14:08    Assessment & Plan:  .There are no diagnoses linked to this encounter.   I spent 34 minutes on the day of this face to face encounter reviewing patient's  most recent visit with cardiology,  nephrology,  and neurology,  prior relevant surgical and non surgical procedures, recent  labs and imaging  studies, counseling on weight management,  reviewing the assessment and plan with patient, and post visit ordering and reviewing of  diagnostics and therapeutics with patient  .   Follow-up: No follow-ups on file.   Verneita LITTIE Kettering, MD

## 2023-10-11 NOTE — Assessment & Plan Note (Signed)
 I reviewed patient's recent  DEXA and explained the significance of her T scores ad the prognostic implications.   The Risks and benefits of all available treatments were reviewed in detail and in the context of patient's medical history.    She is undecided on treatment  and was given printed information and advised to return when ready to start treatment. Discussed the current controversies surrounding the risks and benefits of calcium supplementation.  Encouraged her to increase dietary calcium through natural foods including almond/coconut milk

## 2023-12-13 DIAGNOSIS — D2261 Melanocytic nevi of right upper limb, including shoulder: Secondary | ICD-10-CM | POA: Diagnosis not present

## 2023-12-13 DIAGNOSIS — Z85828 Personal history of other malignant neoplasm of skin: Secondary | ICD-10-CM | POA: Diagnosis not present

## 2023-12-13 DIAGNOSIS — D2262 Melanocytic nevi of left upper limb, including shoulder: Secondary | ICD-10-CM | POA: Diagnosis not present

## 2023-12-13 DIAGNOSIS — D2271 Melanocytic nevi of right lower limb, including hip: Secondary | ICD-10-CM | POA: Diagnosis not present

## 2023-12-13 DIAGNOSIS — D225 Melanocytic nevi of trunk: Secondary | ICD-10-CM | POA: Diagnosis not present

## 2024-02-12 DIAGNOSIS — H40053 Ocular hypertension, bilateral: Secondary | ICD-10-CM | POA: Diagnosis not present

## 2024-04-10 ENCOUNTER — Other Ambulatory Visit: Payer: Self-pay | Admitting: Internal Medicine

## 2024-04-10 DIAGNOSIS — Z1231 Encounter for screening mammogram for malignant neoplasm of breast: Secondary | ICD-10-CM

## 2024-04-11 ENCOUNTER — Encounter: Payer: Self-pay | Admitting: Internal Medicine

## 2024-04-11 ENCOUNTER — Ambulatory Visit: Admitting: Internal Medicine

## 2024-04-11 VITALS — BP 116/78 | HR 65 | Temp 97.5°F | Ht 60.5 in | Wt 132.4 lb

## 2024-04-11 DIAGNOSIS — M810 Age-related osteoporosis without current pathological fracture: Secondary | ICD-10-CM | POA: Diagnosis not present

## 2024-04-11 DIAGNOSIS — Z853 Personal history of malignant neoplasm of breast: Secondary | ICD-10-CM | POA: Diagnosis not present

## 2024-04-11 DIAGNOSIS — R5383 Other fatigue: Secondary | ICD-10-CM | POA: Diagnosis not present

## 2024-04-11 DIAGNOSIS — E7800 Pure hypercholesterolemia, unspecified: Secondary | ICD-10-CM

## 2024-04-11 DIAGNOSIS — R7301 Impaired fasting glucose: Secondary | ICD-10-CM | POA: Diagnosis not present

## 2024-04-11 LAB — COMPREHENSIVE METABOLIC PANEL WITH GFR
ALT: 19 U/L (ref 3–35)
AST: 18 U/L (ref 5–37)
Albumin: 4.5 g/dL (ref 3.5–5.2)
Alkaline Phosphatase: 51 U/L (ref 39–117)
BUN: 14 mg/dL (ref 6–23)
CO2: 30 meq/L (ref 19–32)
Calcium: 9.7 mg/dL (ref 8.4–10.5)
Chloride: 102 meq/L (ref 96–112)
Creatinine, Ser: 0.76 mg/dL (ref 0.40–1.20)
GFR: 76.98 mL/min
Glucose, Bld: 95 mg/dL (ref 70–99)
Potassium: 5.1 meq/L (ref 3.5–5.1)
Sodium: 138 meq/L (ref 135–145)
Total Bilirubin: 1 mg/dL (ref 0.2–1.2)
Total Protein: 6.9 g/dL (ref 6.0–8.3)

## 2024-04-11 LAB — CBC WITH DIFFERENTIAL/PLATELET
Basophils Absolute: 0 K/uL (ref 0.0–0.1)
Basophils Relative: 0.8 % (ref 0.0–3.0)
Eosinophils Absolute: 0.1 K/uL (ref 0.0–0.7)
Eosinophils Relative: 1.9 % (ref 0.0–5.0)
HCT: 41.2 % (ref 36.0–46.0)
Hemoglobin: 13.8 g/dL (ref 12.0–15.0)
Lymphocytes Relative: 32.3 % (ref 12.0–46.0)
Lymphs Abs: 1.6 K/uL (ref 0.7–4.0)
MCHC: 33.4 g/dL (ref 30.0–36.0)
MCV: 94.8 fl (ref 78.0–100.0)
Monocytes Absolute: 0.4 K/uL (ref 0.1–1.0)
Monocytes Relative: 8 % (ref 3.0–12.0)
Neutro Abs: 2.8 K/uL (ref 1.4–7.7)
Neutrophils Relative %: 57 % (ref 43.0–77.0)
Platelets: 181 K/uL (ref 150.0–400.0)
RBC: 4.35 Mil/uL (ref 3.87–5.11)
RDW: 13.6 % (ref 11.5–15.5)
WBC: 5 K/uL (ref 4.0–10.5)

## 2024-04-11 LAB — LIPID PANEL
Cholesterol: 256 mg/dL — ABNORMAL HIGH (ref 28–200)
HDL: 58.8 mg/dL
LDL Cholesterol: 172 mg/dL — ABNORMAL HIGH (ref 10–99)
NonHDL: 196.93
Total CHOL/HDL Ratio: 4
Triglycerides: 125 mg/dL (ref 10.0–149.0)
VLDL: 25 mg/dL (ref 0.0–40.0)

## 2024-04-11 LAB — LDL CHOLESTEROL, DIRECT: Direct LDL: 178 mg/dL

## 2024-04-11 LAB — TSH: TSH: 1.86 u[IU]/mL (ref 0.35–5.50)

## 2024-04-11 LAB — HEMOGLOBIN A1C: Hgb A1c MFr Bld: 5.8 % (ref 4.6–6.5)

## 2024-04-11 NOTE — Patient Instructions (Signed)
 Nice to see you!  Remember to get your TDaP vaccine next November at your pharmacy  I'll see you annually from now on

## 2024-04-11 NOTE — Progress Notes (Unsigned)
 "  Subjective:  Patient ID: Kari Willis, female    DOB: 10-21-1950  Age: 74 y.o. MRN: [id number]  CC: Diagnoses of Impaired fasting glucose, Other fatigue, and Pure hypercholesterolemia were pertinent to this visit.   HPI Kari Willis presents for  Chief Complaint  Patient presents with   Medical Management of Chronic Issues    Using Sleep Eze app on phone for insomnia.  Taking mg glycinate as well.  Improving stool habits.   Going to Dr Jamal dementia classes with 23 yr old husband .    Outpatient Medications Prior to Visit  Medication Sig Dispense Refill   B Complex Vitamins (B COMPLEX-B12 PO) Take 1 tablet by mouth daily.     Calcium Carbonate-Vitamin D  (CALCIUM 600+D HIGH POTENCY PO) Take 1 tablet by mouth 2 (two) times daily.     IRON, FERROUS SULFATE, PO Take 65 mg by mouth 2 (two) times a week.     MAGNESIUM GLYCINATE PO Take by mouth daily.     Multiple Vitamins-Minerals (CENTRUM SILVER ADULT 50+ PO) Take 1 tablet by mouth daily.     niacin 500 MG tablet Take 500 mg by mouth at bedtime.     Omega-3 Fatty Acids (FISH OIL) 1200 MG CAPS Take 1 capsule by mouth.     timolol (TIMOPTIC) 0.5 % ophthalmic solution Place 1 drop into both eyes 2 (two) times daily.     vitamin C (ASCORBIC ACID) 500 MG tablet Take 500 mg by mouth 2 (two) times daily.     vitamin E 400 UNIT capsule Take 400 Units by mouth daily.     No facility-administered medications prior to visit.    Review of Systems;  Patient denies headache, fevers, malaise, unintentional weight loss, skin rash, eye pain, sinus congestion and sinus pain, sore throat, dysphagia,  hemoptysis , cough, dyspnea, wheezing, chest pain, palpitations, orthopnea, edema, abdominal pain, nausea, melena, diarrhea, constipation, flank pain, dysuria, hematuria, urinary  Frequency, nocturia, numbness, tingling, seizures,  Focal weakness, Loss of consciousness,  Tremor, insomnia, depression, anxiety, and suicidal ideation.       Objective:  BP 116/78   Pulse 65   Temp (!) 97.5 F (36.4 C)   Ht 5' 0.5 (1.537 m)   Wt 132 lb 6.4 oz (60.1 kg)   SpO2 96%   BMI 25.43 kg/m   BP Readings from Last 3 Encounters:  04/11/24 116/78  10/10/23 (!) 100/58  05/10/23 120/66    Wt Readings from Last 3 Encounters:  04/11/24 132 lb 6.4 oz (60.1 kg)  10/10/23 132 lb (59.9 kg)  05/10/23 133 lb 12.8 oz (60.7 kg)    Physical Exam Vitals reviewed.  Constitutional:      General: She is not in acute distress.    Appearance: Normal appearance. She is normal weight. She is not ill-appearing, toxic-appearing or diaphoretic.  HENT:     Head: Normocephalic.  Eyes:     General: No scleral icterus.       Right eye: No discharge.        Left eye: No discharge.     Conjunctiva/sclera: Conjunctivae normal.  Cardiovascular:     Rate and Rhythm: Normal rate and regular rhythm.     Heart sounds: Normal heart sounds.  Pulmonary:     Effort: Pulmonary effort is normal. No respiratory distress.     Breath sounds: Normal breath sounds.  Musculoskeletal:        General: Normal range of motion.  Skin:  General: Skin is warm and dry.  Neurological:     General: No focal deficit present.     Mental Status: She is alert and oriented to person, place, and time. Mental status is at baseline.  Psychiatric:        Mood and Affect: Mood normal.        Behavior: Behavior normal.        Thought Content: Thought content normal.        Judgment: Judgment normal.    Lab Results  Component Value Date   HGBA1C 5.7 05/10/2023   HGBA1C 5.8 10/07/2020    Lab Results  Component Value Date   CREATININE 0.87 05/10/2023   CREATININE 0.98 10/07/2020   CREATININE 0.90 10/17/2017    Lab Results  Component Value Date   WBC 4.7 05/10/2023   HGB 14.0 05/10/2023   HCT 41.9 05/10/2023   PLT 190.0 05/10/2023   GLUCOSE 96 05/10/2023   CHOL 232 (H) 05/10/2023   TRIG 96.0 05/10/2023   HDL 57.00 05/10/2023   LDLDIRECT 160.0  05/10/2023   LDLCALC 156 (H) 05/10/2023   ALT 16 05/10/2023   AST 17 05/10/2023   NA 136 05/10/2023   K 4.2 05/10/2023   CL 100 05/10/2023   CREATININE 0.87 05/10/2023   BUN 18 05/10/2023   CO2 29 05/10/2023   TSH 2.02 05/10/2023   HGBA1C 5.7 05/10/2023    DG Bone Density Result Date: 07/06/2023 EXAM: DUAL X-RAY ABSORPTIOMETRY (DXA) FOR BONE MINERAL DENSITY IMPRESSION: Your patient Kari Willis on 07/06/2023 using the [employer] iDXA DXA System (software version: 14.10) manufactured by [employer]. The following summarizes the results of our evaluation. Technologist:VLM PATIENT BIOGRAPHICAL: Name: Kari, Willis Patient ID: [id number] Birth Date: September 22, 1950 Height: 60.5 in. Gender: Female Exam Date: 07/06/2023 Weight: 133.8 lbs. Indications: Caucasian, Postmenopausal Fractures: Treatments: calcium w/ vit D DENSITOMETRY RESULTS: Site      Region     Measured Date Measured Age WHO Classification Young Adult T-score BMD         %Change vs. Previous Significant Change (*) AP Spine L1-L4 07/06/2023 73.0 Osteoporosis -2.5 0.882 g/cm2 - - DualFemur Neck Right 07/06/2023 73.0 Osteoporosis -2.7 0.664 g/cm2 - - ASSESSMENT: The BMD measured at Femur Neck Right is 0.664 g/cm2 with a T-score of -2.7. This patient is considered osteoporotic according to World Health Organization [facility]) criteria. The scan quality is good. World Science Writer [facility]) criteria for post-menopausal, Caucasian Women: Normal:                   T-score at or above -1 SD Osteopenia/low bone mass: T-score between -1 and -2.5 SD Osteoporosis:             T-score at or below -2.5 SD RECOMMENDATIONS: 1. All patients should optimize calcium and vitamin D  intake. 2. Consider FDA-approved medical therapies in postmenopausal women and men aged 80 years and older, based on the following: a. A hip or vertebral(clinical or morphometric) fracture b. T-score < -2.5 at the femoral neck or spine after appropriate  evaluation to exclude secondary causes c. Low bone mass (T-score between -1.0 and -2.5 at the femoral neck or spine) and a 10-year probability of a hip fracture > 3% or a 10-year probability of a major osteoporosis-related fracture > 20% based on the [place] -adapted WHO algorithm 3. Clinician judgment and/or patient preferences may indicate treatment for people with 10-year fracture probabilities above or below these levels FOLLOW-UP: People with diagnosed  cases of osteoporosis or at high risk for fracture should have regular bone mineral density tests. For patients eligible for Medicare, routine testing is allowed once every 2 years. The testing frequency can be increased to one year for patients who have rapidly progressing disease, those who are receiving or discontinuing medical therapy to restore bone mass, or have additional risk factors. I have reviewed this report, and agree with the above findings. [facility] Electronically Signed   By: Rosaline Collet M.D.   On: 07/06/2023 14:08    Assessment & Plan:  .Impaired fasting glucose -     Hemoglobin A1c -     Comprehensive metabolic panel with GFR  Other fatigue -     TSH -     CBC with Differential/Platelet  Pure hypercholesterolemia -     LDL cholesterol, direct -     Lipid panel     I spent 34 minutes on the day of this face to face encounter reviewing patient's  most recent visit with cardiology,  nephrology,  and neurology,  prior relevant surgical and non surgical procedures, recent  labs and imaging studies, counseling on weight management,  reviewing the assessment and plan with patient, and post visit ordering and reviewing of  diagnostics and therapeutics with patient  .   Follow-up: No follow-ups on file.   Verneita LITTIE Kettering, MD "

## 2024-04-13 ENCOUNTER — Ambulatory Visit: Payer: Self-pay | Admitting: Internal Medicine

## 2024-04-13 NOTE — Progress Notes (Signed)
 Patient ID: Kari Willis, female    DOB: [birth year]  Age: 74 y.o. MRN: [id number]  The patient is here for follow up and management of other chronicproblems.   The risk factors are reflected in the social history.   The roster of all physicians providing medical care to patient - is listed in the Snapshot section of the chart.   Activities of daily living:  The patient is 100% independent in all ADLs: dressing, toileting, feeding as well as independent mobility   Home safety : The patient has smoke detectors in the home. They wear seatbelts.  There are no unsecured firearms at home. There is no violence in the home.    There is no risks for hepatitis, STDs or HIV. There is no   history of blood transfusion. They have no travel history to infectious disease endemic areas of the world.   The patient has seen their dentist in the last six month. They have seen their eye doctor in the last year. The patinet  denies slight hearing difficulty with regard to whispered voices and some television programs.  They have deferred audiologic testing in the last year.  They do not  have excessive sun exposure. Discussed the need for sun protection: hats, long sleeves and use of sunscreen if there is significant sun exposure.    Diet: the importance of a healthy diet is discussed. They do have a healthy diet.   The benefits of regular aerobic exercise were discussed. The patient  exercises  5 to 6  days per week  for  60 minutes.    Depression screen: there are no signs or vegative symptoms of depression- irritability, change in appetite, anhedonia, sadness/tearfullness.   The following portions of the patient's history were reviewed and updated as appropriate: allergies, current medications, past family history, past medical history,  past surgical history, past social history  and problem list.   Visual acuity was not assessed per patient preference since the patient has regular follow up with an   ophthalmologist. Hearing and body mass index were assessed and reviewed.    During the course of the visit the patient was educated and counseled about appropriate screening and preventive services including : fall prevention , diabetes screening, nutrition counseling, colorectal cancer screening, and recommended immunizations.    Chief Complaint:      Review of Symptoms  Patient denies headache, fevers, malaise, unintentional weight loss, skin rash, eye pain, sinus congestion and sinus pain, sore throat, dysphagia,  hemoptysis , cough, dyspnea, wheezing, chest pain, palpitations, orthopnea, edema, abdominal pain, nausea, melena, diarrhea, constipation, flank pain, dysuria, hematuria, urinary  Frequency, nocturia, numbness, tingling, seizures,  Focal weakness, Loss of consciousness,  Tremor, insomnia, depression, anxiety, and suicidal ideation.    Physical Exam:  BP 116/78   Pulse 65   Temp (!) 97.5 F (36.4 C)   Ht 5' 0.5 (1.537 m)   Wt 132 lb 6.4 oz (60.1 kg)   SpO2 96%   BMI 25.43 kg/m    Physical Exam Vitals reviewed.  Constitutional:      General: She is not in acute distress.    Appearance: Normal appearance. She is normal weight. She is not ill-appearing, toxic-appearing or diaphoretic.  HENT:     Head: Normocephalic.  Eyes:     General: No scleral icterus.       Right eye: No discharge.        Left eye: No discharge.     Conjunctiva/sclera: Conjunctivae normal.  Cardiovascular:     Rate and Rhythm: Normal rate and regular rhythm.     Heart sounds: Normal heart sounds.  Pulmonary:     Effort: Pulmonary effort is normal. No respiratory distress.     Breath sounds: Normal breath sounds.  Musculoskeletal:        General: Normal range of motion.  Skin:    General: Skin is warm and dry.  Neurological:     General: No focal deficit present.     Mental Status: She is alert and oriented to person, place, and time. Mental status is at baseline.  Psychiatric:         Mood and Affect: Mood normal.        Behavior: Behavior normal.        Thought Content: Thought content normal.        Judgment: Judgment normal.     Assessment and Plan: Osteoporosis without current pathological fracture, unspecified osteoporosis type Assessment & Plan: I reviewed patient's recent  DEXA and explained the significance of her T scores ad the prognostic implications.   The Risks and benefits of all available treatments were reviewed in detail and in the context of patient's medical history.    She has deferred treatment I.  I reviewed  the current controversies surrounding the risks and benefits of calcium supplementation.  Encouraged her to increase dietary calcium through natural foods including almond/coconut milk     Impaired fasting glucose -     Hemoglobin A1c -     Comprehensive metabolic panel with GFR  Other fatigue -     TSH -     CBC with Differential/Platelet  Pure hypercholesterolemia Assessment & Plan: 10 yr risk is 12% primarily due to age . No treatment advised.   Lab Results  Component Value Date   CHOL 256 (H) 04/11/2024   HDL 58.80 04/11/2024   LDLCALC 172 (H) 04/11/2024   LDLDIRECT 178.0 04/11/2024   TRIG 125.0 04/11/2024   CHOLHDL 4 04/11/2024     Orders: -     LDL cholesterol, direct -     Lipid panel  History of breast cancer Assessment & Plan: S/p lumpectomy and XRT in 2013.  Annual Mammogram is due in March      Return in about 1 year (around 04/11/2025).  Kari LITTIE Kettering, MD

## 2024-04-13 NOTE — Assessment & Plan Note (Signed)
 I reviewed patient's recent  DEXA and explained the significance of her T scores ad the prognostic implications.   The Risks and benefits of all available treatments were reviewed in detail and in the context of patient's medical history.    She has deferred treatment I.  I reviewed  the current controversies surrounding the risks and benefits of calcium supplementation.  Encouraged her to increase dietary calcium through natural foods including almond/coconut milk

## 2024-04-13 NOTE — Assessment & Plan Note (Signed)
 10 yr risk is 12% primarily due to age . No treatment advised.   Lab Results  Component Value Date   CHOL 256 (H) 04/11/2024   HDL 58.80 04/11/2024   LDLCALC 172 (H) 04/11/2024   LDLDIRECT 178.0 04/11/2024   TRIG 125.0 04/11/2024   CHOLHDL 4 04/11/2024

## 2024-04-13 NOTE — Assessment & Plan Note (Signed)
 S/p lumpectomy and XRT in 2013.  Annual Mammogram is due in March

## 2024-06-24 ENCOUNTER — Ambulatory Visit

## 2025-04-15 ENCOUNTER — Ambulatory Visit: Admitting: Internal Medicine
